# Patient Record
Sex: Female | Born: 1975 | Race: Black or African American | Hispanic: No | Marital: Married | Smoking: Never smoker
Health system: Southern US, Community
[De-identification: ages and names within clinical notes are randomized; demographics above are authoritative.]

## PROBLEM LIST (undated history)

## (undated) DIAGNOSIS — F419 Anxiety disorder, unspecified: Secondary | ICD-10-CM

## (undated) DIAGNOSIS — B379 Candidiasis, unspecified: Secondary | ICD-10-CM

## (undated) DIAGNOSIS — K8020 Calculus of gallbladder without cholecystitis without obstruction: Secondary | ICD-10-CM

## (undated) DIAGNOSIS — Z87440 Personal history of urinary (tract) infections: Secondary | ICD-10-CM

## (undated) DIAGNOSIS — M1990 Unspecified osteoarthritis, unspecified site: Secondary | ICD-10-CM

## (undated) DIAGNOSIS — O24419 Gestational diabetes mellitus in pregnancy, unspecified control: Secondary | ICD-10-CM

## (undated) HISTORY — PX: BREAST SURGERY: SHX581

## (undated) HISTORY — PX: OTHER SURGICAL HISTORY: SHX169

## (undated) HISTORY — PX: CARPAL TUNNEL RELEASE: SHX101

---

## 1997-07-07 ENCOUNTER — Inpatient Hospital Stay (HOSPITAL_COMMUNITY): Admission: AD | Admit: 1997-07-07 | Discharge: 1997-07-07 | Payer: Self-pay | Admitting: *Deleted

## 1997-07-16 ENCOUNTER — Inpatient Hospital Stay (HOSPITAL_COMMUNITY): Admission: AD | Admit: 1997-07-16 | Discharge: 1997-07-16 | Payer: Self-pay | Admitting: *Deleted

## 1997-07-17 ENCOUNTER — Ambulatory Visit (HOSPITAL_COMMUNITY): Admission: RE | Admit: 1997-07-17 | Discharge: 1997-07-17 | Payer: Self-pay | Admitting: Obstetrics

## 1997-07-19 ENCOUNTER — Ambulatory Visit (HOSPITAL_COMMUNITY): Admission: RE | Admit: 1997-07-19 | Discharge: 1997-07-19 | Payer: Self-pay | Admitting: Obstetrics

## 1997-07-21 ENCOUNTER — Inpatient Hospital Stay (HOSPITAL_COMMUNITY): Admission: AD | Admit: 1997-07-21 | Discharge: 1997-07-21 | Payer: Self-pay | Admitting: Obstetrics & Gynecology

## 1997-07-21 ENCOUNTER — Encounter (HOSPITAL_COMMUNITY): Admission: RE | Admit: 1997-07-21 | Discharge: 1997-10-19 | Payer: Self-pay | Admitting: Obstetrics

## 1997-07-22 ENCOUNTER — Inpatient Hospital Stay (HOSPITAL_COMMUNITY): Admission: AD | Admit: 1997-07-22 | Discharge: 1997-07-22 | Payer: Self-pay | Admitting: Obstetrics & Gynecology

## 1997-07-22 ENCOUNTER — Ambulatory Visit (HOSPITAL_COMMUNITY): Admission: RE | Admit: 1997-07-22 | Discharge: 1997-07-22 | Payer: Self-pay | Admitting: Obstetrics & Gynecology

## 1997-07-25 ENCOUNTER — Ambulatory Visit (HOSPITAL_COMMUNITY): Admission: RE | Admit: 1997-07-25 | Discharge: 1997-07-25 | Payer: Self-pay | Admitting: *Deleted

## 1997-08-04 ENCOUNTER — Inpatient Hospital Stay (HOSPITAL_COMMUNITY): Admission: AD | Admit: 1997-08-04 | Discharge: 1997-08-04 | Payer: Self-pay | Admitting: Obstetrics & Gynecology

## 1998-04-11 ENCOUNTER — Other Ambulatory Visit: Admission: RE | Admit: 1998-04-11 | Discharge: 1998-04-11 | Payer: Self-pay | Admitting: *Deleted

## 1999-05-15 ENCOUNTER — Other Ambulatory Visit: Admission: RE | Admit: 1999-05-15 | Discharge: 1999-05-15 | Payer: Self-pay | Admitting: *Deleted

## 2000-05-30 ENCOUNTER — Other Ambulatory Visit: Admission: RE | Admit: 2000-05-30 | Discharge: 2000-05-30 | Payer: Self-pay | Admitting: *Deleted

## 2001-06-02 ENCOUNTER — Other Ambulatory Visit: Admission: RE | Admit: 2001-06-02 | Discharge: 2001-06-02 | Payer: Self-pay | Admitting: Obstetrics and Gynecology

## 2002-06-23 ENCOUNTER — Other Ambulatory Visit: Admission: RE | Admit: 2002-06-23 | Discharge: 2002-06-23 | Payer: Self-pay | Admitting: Obstetrics and Gynecology

## 2015-06-05 ENCOUNTER — Encounter: Payer: Medicaid Other | Attending: Obstetrics and Gynecology | Admitting: Skilled Nursing Facility1

## 2015-06-05 VITALS — Ht 60.0 in | Wt 147.0 lb

## 2015-06-05 DIAGNOSIS — Z3A00 Weeks of gestation of pregnancy not specified: Secondary | ICD-10-CM | POA: Diagnosis not present

## 2015-06-05 DIAGNOSIS — O24419 Gestational diabetes mellitus in pregnancy, unspecified control: Secondary | ICD-10-CM | POA: Diagnosis not present

## 2015-06-05 DIAGNOSIS — O24410 Gestational diabetes mellitus in pregnancy, diet controlled: Secondary | ICD-10-CM

## 2015-06-07 ENCOUNTER — Encounter: Payer: Self-pay | Admitting: Skilled Nursing Facility1

## 2015-06-07 NOTE — Progress Notes (Signed)
  Patient was seen on 06/07/2015 for Gestational Diabetes self-management class at the [facility]. The following learning objectives were met by the patient during this course:   States the definition of Gestational Diabetes  States why dietary management is important in controlling blood glucose  Describes the effects each nutrient has on blood glucose levels  Demonstrates ability to create a balanced meal plan  Demonstrates carbohydrate counting   States when to check blood glucose levels  Demonstrates proper blood glucose monitoring techniques  States the effect of stress and exercise on blood glucose levels  States the importance of limiting caffeine and abstaining from alcohol and smoking  Blood glucose monitor given:  *Meter not given due to insurance coverage  Patient instructed to monitor glucose levels: FBS: 60 - <90 1 hour: <140 2 hour: <120  *Patient received handouts:  Nutrition Diabetes and Pregnancy  Carbohydrate Counting List  Patient will be seen for follow-up as needed.

## 2015-06-12 ENCOUNTER — Ambulatory Visit: Payer: Self-pay

## 2015-07-25 ENCOUNTER — Encounter (HOSPITAL_COMMUNITY): Payer: Self-pay

## 2015-07-25 ENCOUNTER — Inpatient Hospital Stay (HOSPITAL_COMMUNITY): Payer: Medicaid Other

## 2015-07-25 ENCOUNTER — Inpatient Hospital Stay (HOSPITAL_COMMUNITY)
Admission: AD | Admit: 2015-07-25 | Discharge: 2015-08-01 | DRG: 766 | Disposition: A | Discharge status: Home / Self Care | Payer: Medicaid Other | Source: Ambulatory Visit | Attending: Obstetrics & Gynecology | Admitting: Obstetrics & Gynecology

## 2015-07-25 DIAGNOSIS — O24425 Gestational diabetes mellitus in childbirth, controlled by oral hypoglycemic drugs: Secondary | ICD-10-CM | POA: Diagnosis present

## 2015-07-25 DIAGNOSIS — Z302 Encounter for sterilization: Secondary | ICD-10-CM

## 2015-07-25 DIAGNOSIS — O42913 Preterm premature rupture of membranes, unspecified as to length of time between rupture and onset of labor, third trimester: Secondary | ICD-10-CM | POA: Diagnosis present

## 2015-07-25 DIAGNOSIS — O4290 Premature rupture of membranes, unspecified as to length of time between rupture and onset of labor, unspecified weeks of gestation: Secondary | ICD-10-CM

## 2015-07-25 DIAGNOSIS — Z833 Family history of diabetes mellitus: Secondary | ICD-10-CM | POA: Diagnosis not present

## 2015-07-25 DIAGNOSIS — Z8751 Personal history of pre-term labor: Secondary | ICD-10-CM

## 2015-07-25 DIAGNOSIS — Z23 Encounter for immunization: Secondary | ICD-10-CM | POA: Diagnosis not present

## 2015-07-25 DIAGNOSIS — O42013 Preterm premature rupture of membranes, onset of labor within 24 hours of rupture, third trimester: Secondary | ICD-10-CM

## 2015-07-25 DIAGNOSIS — Z3A33 33 weeks gestation of pregnancy: Secondary | ICD-10-CM

## 2015-07-25 DIAGNOSIS — Z98891 History of uterine scar from previous surgery: Secondary | ICD-10-CM

## 2015-07-25 DIAGNOSIS — O34211 Maternal care for low transverse scar from previous cesarean delivery: Secondary | ICD-10-CM | POA: Diagnosis present

## 2015-07-25 HISTORY — DX: Anxiety disorder, unspecified: F41.9

## 2015-07-25 HISTORY — DX: Gestational diabetes mellitus in pregnancy, unspecified control: O24.419

## 2015-07-25 LAB — GLUCOSE, CAPILLARY
GLUCOSE-CAPILLARY: 61 mg/dL — AB (ref 65–99)
Glucose-Capillary: 157 mg/dL — ABNORMAL HIGH (ref 65–99)
Glucose-Capillary: 175 mg/dL — ABNORMAL HIGH (ref 65–99)
Glucose-Capillary: 176 mg/dL — ABNORMAL HIGH (ref 65–99)

## 2015-07-25 LAB — CBC
HCT: 34.7 % — ABNORMAL LOW (ref 36.0–46.0)
Hemoglobin: 11.9 g/dL — ABNORMAL LOW (ref 12.0–15.0)
MCH: 26.6 pg (ref 26.0–34.0)
MCHC: 34.3 g/dL (ref 30.0–36.0)
MCV: 77.5 fL — ABNORMAL LOW (ref 78.0–100.0)
Platelets: 201 10*3/uL (ref 150–400)
RBC: 4.48 MIL/uL (ref 3.87–5.11)
RDW: 14.5 % (ref 11.5–15.5)
WBC: 9 10*3/uL (ref 4.0–10.5)

## 2015-07-25 LAB — TYPE AND SCREEN
ABO/RH(D): O POS
Antibody Screen: NEGATIVE

## 2015-07-25 LAB — ABO/RH: ABO/RH(D): O POS

## 2015-07-25 MED ORDER — ZOLPIDEM TARTRATE 5 MG PO TABS
5.0000 mg | ORAL_TABLET | Freq: Every evening | ORAL | Status: DC | PRN
  Administered 2015-07-26: 5 mg via ORAL
  Filled 2015-07-25: qty 1

## 2015-07-25 MED ORDER — NIFEDIPINE 10 MG PO CAPS
10.0000 mg | ORAL_CAPSULE | ORAL | Status: DC | PRN
  Administered 2015-07-25: 10 mg via ORAL

## 2015-07-25 MED ORDER — LACTATED RINGERS IV BOLUS (SEPSIS)
500.0000 mL | Freq: Once | INTRAVENOUS | Status: AC
Start: 2015-07-25 — End: 2015-07-25
  Administered 2015-07-25: 500 mL via INTRAVENOUS

## 2015-07-25 MED ORDER — DOCUSATE SODIUM 100 MG PO CAPS
100.0000 mg | ORAL_CAPSULE | Freq: Every day | ORAL | Status: DC
  Administered 2015-07-25 – 2015-07-27 (×3): 100 mg via ORAL
  Filled 2015-07-25 (×4): qty 1

## 2015-07-25 MED ORDER — GLYBURIDE 5 MG PO TABS
5.0000 mg | ORAL_TABLET | Freq: Every day | ORAL | Status: DC
  Administered 2015-07-25 – 2015-07-27 (×3): 5 mg via ORAL
  Filled 2015-07-25 (×4): qty 1

## 2015-07-25 MED ORDER — LACTATED RINGERS IV BOLUS (SEPSIS): 500.0000 mL | Freq: Once | INTRAVENOUS | Status: AC

## 2015-07-25 MED ORDER — SODIUM CHLORIDE 0.9 % IV SOLN
2.0000 g | Freq: Four times a day (QID) | INTRAVENOUS | Status: AC
  Administered 2015-07-25 – 2015-07-27 (×8): 2 g via INTRAVENOUS
  Filled 2015-07-25 (×8): qty 2000

## 2015-07-25 MED ORDER — MAGNESIUM SULFATE 50 % IJ SOLN
2.0000 g/h | INTRAVENOUS | Status: DC
  Administered 2015-07-26: 2 g/h via INTRAVENOUS
  Filled 2015-07-25 (×3): qty 80

## 2015-07-25 MED ORDER — NIFEDIPINE 10 MG PO CAPS
10.0000 mg | ORAL_CAPSULE | Freq: Once | ORAL | Status: DC
  Filled 2015-07-25: qty 1

## 2015-07-25 MED ORDER — MAGNESIUM SULFATE BOLUS VIA INFUSION
4.0000 g | Freq: Once | INTRAVENOUS | Status: AC
  Administered 2015-07-25: 4 g via INTRAVENOUS
  Filled 2015-07-25: qty 500

## 2015-07-25 MED ORDER — ACETAMINOPHEN 325 MG PO TABS: 650.0000 mg | ORAL_TABLET | ORAL | Status: DC | PRN

## 2015-07-25 MED ORDER — CALCIUM CARBONATE ANTACID 500 MG PO CHEW
2.0000 | CHEWABLE_TABLET | ORAL | Status: DC | PRN
  Administered 2015-07-27 (×2): 400 mg via ORAL
  Filled 2015-07-25 (×4): qty 2

## 2015-07-25 MED ORDER — INSULIN ASPART 100 UNIT/ML ~~LOC~~ SOLN
0.0000 [IU] | Freq: Three times a day (TID) | SUBCUTANEOUS | Status: DC
  Administered 2015-07-26 (×2): 5 [IU] via SUBCUTANEOUS

## 2015-07-25 MED ORDER — GLYBURIDE 2.5 MG PO TABS
2.5000 mg | ORAL_TABLET | Freq: Every day | ORAL | Status: DC
  Administered 2015-07-25 – 2015-07-26 (×2): 2.5 mg via ORAL
  Filled 2015-07-25 (×3): qty 1

## 2015-07-25 MED ORDER — AMOXICILLIN 500 MG PO CAPS
500.0000 mg | ORAL_CAPSULE | Freq: Three times a day (TID) | ORAL | Status: DC
  Administered 2015-07-27 (×2): 500 mg via ORAL
  Filled 2015-07-25 (×3): qty 1

## 2015-07-25 MED ORDER — NYSTATIN 100000 UNIT/ML MT SUSP
5.0000 mL | Freq: Four times a day (QID) | OROMUCOSAL | Status: DC
  Administered 2015-07-25 – 2015-07-27 (×8): 500000 [IU] via ORAL
  Filled 2015-07-25 (×11): qty 5

## 2015-07-25 MED ORDER — LACTATED RINGERS IV SOLN
INTRAVENOUS | Status: DC
  Administered 2015-07-25 – 2015-07-28 (×8): via INTRAVENOUS

## 2015-07-25 MED ORDER — AZITHROMYCIN 250 MG PO TABS
1000.0000 mg | ORAL_TABLET | Freq: Once | ORAL | Status: AC
  Administered 2015-07-25: 1000 mg via ORAL
  Filled 2015-07-25: qty 4

## 2015-07-25 MED ORDER — BETAMETHASONE SOD PHOS & ACET 6 (3-3) MG/ML IJ SUSP
12.0000 mg | INTRAMUSCULAR | Status: AC
  Administered 2015-07-25 – 2015-07-26 (×2): 12 mg via INTRAMUSCULAR
  Filled 2015-07-25 (×2): qty 2

## 2015-07-25 MED ORDER — NYSTATIN 100000 UNIT/ML MT SUSP
5.0000 mL | Freq: Four times a day (QID) | OROMUCOSAL | Status: DC
  Filled 2015-07-25 (×3): qty 5

## 2015-07-25 MED ORDER — INSULIN ASPART 100 UNIT/ML ~~LOC~~ SOLN
0.0000 [IU] | Freq: Every day | SUBCUTANEOUS | Status: DC
  Administered 2015-07-26: 2 [IU] via SUBCUTANEOUS

## 2015-07-25 MED ORDER — PRENATAL MULTIVITAMIN CH
1.0000 | ORAL_TABLET | Freq: Every day | ORAL | Status: DC
  Administered 2015-07-26: 1 via ORAL
  Filled 2015-07-25 (×3): qty 1

## 2015-07-25 NOTE — H&P (Signed)
Robin Cooke is a 40 y.o. female G3P1011  presenting for PPROM @ 33 weeks 0 days  Pregnancy followed at [facility] since 9 weeks  and remarkable for: Previous C/S; GDM on glyburide; Hx of PTD; AMA; Multiple Fibroids  SROM large amount clear fluid @ 0500 07/25/15  OB History    Gravida Para Term Preterm AB TAB SAB Ectopic Multiple Living   3 1 1  0 1 0 0 1 0 1     History reviewed. No pertinent past medical history. Past Surgical History  Procedure Laterality Date  . Cesarean section    . Breast surgery      Family History:   family history includes Asthma in her other; Diabetes in her other; Stroke in her other. Social History:    reports that she has never smoked. She does not have any smokeless tobacco history on file. She reports that she does not drink alcohol or use illicit drugs.   Prenatal labs: ABO, Rh:  O+ Antibody:  neg Rubella: !Error!Immune RPR:    HBsAg:   Neg HIV:   Neg GBS:      Prenatal Transfer Tool  Maternal Diabetes: Yes:  Diabetes Type:  Insulin/Medication controlled Genetic Screening: Normal Maternal Ultrasounds/Referrals: Normal Fetal Ultrasounds or other Referrals:  None Maternal Substance Abuse:  No Significant Maternal Medications:  None Significant Maternal Lab Results: None   Dilation: 1 Exam by:: Ramsie Ostrander RN Blood pressure 122/70, pulse 110, temperature 99 F (37.2 C), temperature source Oral, resp. rate 18, height 5' 0.5" (1.537 m), weight 151 lb (68.493 kg).  General Appearance: Alert, appropriate appearance for age. No acute distress HEENT Exam: Grossly normal Chest/Respiratory Exam: Normal chest wall and respirations. Clear to auscultation Cardiovascular Exam: Regular rate and rhythm. S1, S2, no murmur Gastrointestinal Exam: soft, non-tender, Uterus gravid with size compatible with GA, Vertex presentation by Leopold's maneuvers Psychiatric Exam: Alert and oriented, appropriate  affect  ++++++++++++++++++++++++++++++++++++++++++++++++++++++++++++++++  Vaginal exam: 1 cm by sterile speculum exam. No digital exam performed  Fetal tracings: Category 1 ; no contractions  ++++++++++++++++++++++++++++++++++++++++++++++++++++++++++++++++   Assessment/Plan: PPROM @ 33 weeks 0 days Admit to antenatal unit after consulting with Dr. Alesia Richards Steroids Latency ABX- ampicillin and azithromycin Ultrasound MFM consult Cbc gbs culture NPO      Delsa Bern MD 07/25/2015, 7:27 AM

## 2015-07-25 NOTE — MAU Note (Addendum)
PT  SAYS  SROM  AT  0500.  - CLEAR.     DENIES     HSV AND MRSA.    VE  LAST WEEK - CLOSED

## 2015-07-25 NOTE — Progress Notes (Signed)
This note also relates to the following rows which could not be included: Pt has BMZ this morning.

## 2015-07-25 NOTE — Progress Notes (Signed)
Hospital day # 0 pregnancy at [redacted]w[redacted]d with PPROM and preterm labor  S: Received 1 dose of Procardia upon admission which spaced out her contractions but then recurred      Now responding well to Magnesium Sulfate with rare contractions not felt by patient      Ongoing LOF with some bloody show  O: BP 101/62 mmHg  Pulse 107  Temp(Src) 99 F (37.2 C) (Oral)  Resp 20  Ht 5' 0.5" (1.537 m)  Wt 151 lb (68.493 kg)  BMI 28.99 kg/m2  SpO2 99%      Fetal tracings:reviewed and reassuring      Uterus gravid and non-tender      Extremities: no significant edema and no signs of DVT       VE: FT/ 30 % effaced/ presenting part not felt  A: 1.[redacted]w[redacted]d with PPROM and preterm labor      2. Class A2 DB on Glyburide      3. Previous cesarean delivery      4. Uncertain presentation  P: 1. Continue tocolysis until BMZ series is complete      2. If persistent active labor, patient is requesting repeat cesarean delivery with BTL as planned      3. Will plan delivery at 34 weeks      4. Continue latency antibiotics per protocol      5. Awaiting ultrasound for presentation and BPP Questions answered  Robin Cooke A  MD 07/25/2015 2:27 PM

## 2015-07-25 NOTE — Progress Notes (Signed)
Pt c/o abdominal pain and pelvic pressure. Describes pain as tightness in mid abdomen which causes pelvic pressure.  Pt states pain started after eating.  EFM to be applied to eval for ctxs.

## 2015-07-26 LAB — GLUCOSE, CAPILLARY
GLUCOSE-CAPILLARY: 202 mg/dL — AB (ref 65–99)
Glucose-Capillary: 81 mg/dL (ref 65–99)
Glucose-Capillary: 221 mg/dL — ABNORMAL HIGH (ref 65–99)
Glucose-Capillary: 215 mg/dL — ABNORMAL HIGH (ref 65–99)

## 2015-07-26 LAB — URINE CULTURE
Culture: NO GROWTH
Special Requests: NORMAL

## 2015-07-26 NOTE — Progress Notes (Signed)
Initial Nutrition Assessment  DOCUMENTATION CODES:   Obesity unspecified  INTERVENTION:  Carbohydrate modified gestational diet  NUTRITION DIAGNOSIS:   Increased nutrient needs related to  (r/t pregnancy and fetal growth requirements) as evidenced by  (33 1/7 weeks IUP).  GOAL:   Patient will meet greater than or equal to 90% of their needs, Weight gain  MONITOR:  Weight trends  REASON FOR ASSESSMENT:  Antenatal, Gestational Diabetes   ASSESSMENT:   33 1/7 weeks, PROM. 3 lb weight gain. Pt reports Hx of nausea early in preg, which reduced appetite. Currently with thrush which she says minimizes appetite. Has long list of foods which she reports cause nausea. Does not tolerate milk well. Has good understanding of GDM diet.  Diet Order:  Diet gestational carb mod Room service appropriate?: Yes; Fluid consistency:: Thin  Skin:  Reviewed, no issues  Height:   Ht Readings from Last 1 Encounters:  07/25/15 5' 0.5" (1.537 m)   Weight:   Wt Readings from Last 1 Encounters:  07/26/15 151 lb 11.2 oz (68.811 kg)    Ideal Body Weight:    100-105 lbs  BMI:  Body mass index is 29.13 kg/(m^2).  Estimated Nutritional Needs:   Kcal:  1800-2000  Protein:  80-90 g  Fluid:  2.1 L  EDUCATION NEEDS:   No education needs identified at this time (educ at [facility] on 05/2015)  Weyman Rodney M.Fredderick Severance LDN Neonatal Nutrition Support Specialist/RD III Pager [phone]      Phone [phone]

## 2015-07-26 NOTE — Progress Notes (Addendum)
Patient ID: Robin Cooke, female   DOB: 26-Aug-1975, 40 y.o.   MRN: [id number] Robin Cooke is a 40 y.o. G3P1011 at [redacted]w[redacted]d admitted for PPROM  Subjective: Feels occasional contractions  Objective: BP 114/66 mmHg  Pulse 104  Temp(Src) 99.1 F (37.3 C) (Oral)  Resp 18  Ht 5' 0.5" (1.537 m)  Wt 151 lb 11.2 oz (68.811 kg)  BMI 29.13 kg/m2  SpO2 99% I/O last 3 completed shifts: In: 4637.3 [P.O.:2091; I.V.:2396.3; IV Piggyback:150] Out: 3050 [Urine:3050] Total I/O In: 340 [P.O.:340] Out: 550 [Urine:550]  Physical Exam:  Gen: alert Chest/Lungs: cta bilaterally  Heart/Pulse: RRR  Abdomen: soft, gravid, nontender Uterine fundus: soft, nontender Skin & Color: warm and dry  EXT: negative Homan's b/l, edema trace  FHT:  FHR: 140-150s bpm, variability: moderate,  accelerations:  Present,  decelerations:  Absent UC:   irregular SVE:   Dilation: Fingertip Effacement (%): 30 Exam by:: Dr. Cletis Media  GBS pending UCx pending  Labs: Lab Results  Component Value Date   WBC 9.0 07/25/2015   HGB 11.9* 07/25/2015   HCT 34.7* 07/25/2015   MCV 77.5* 07/25/2015   PLT 201 07/25/2015   U/S oblique lie U/s in office 07/18/15 4lbs 6oz 53%  Assessment and Plan: has Preterm premature rupture of membranes (PPROM) with onset of labor within 24 hours of rupture in third trimester, antepartum on her problem list. Stable on magnesium Will d/c mg 24hrs after 2nd dose of BMZ Fetal status is overall reassuring Repeat C/S and sterilization if labors  Robin Cooke Y 07/26/2015, 11:33 AM

## 2015-07-27 LAB — CULTURE, BETA STREP (GROUP B ONLY)

## 2015-07-27 LAB — GLUCOSE, CAPILLARY
GLUCOSE-CAPILLARY: 99 mg/dL (ref 65–99)
GLUCOSE-CAPILLARY: 145 mg/dL — AB (ref 65–99)
Glucose-Capillary: 132 mg/dL — ABNORMAL HIGH (ref 65–99)

## 2015-07-27 MED ORDER — SOD CITRATE-CITRIC ACID 500-334 MG/5ML PO SOLN
ORAL | Status: AC
  Administered 2015-07-27: 30 mL
  Filled 2015-07-27: qty 15

## 2015-07-27 MED ORDER — SIMETHICONE 80 MG PO CHEW
80.0000 mg | CHEWABLE_TABLET | Freq: Four times a day (QID) | ORAL | Status: DC | PRN
  Administered 2015-07-27: 80 mg via ORAL
  Filled 2015-07-27: qty 1

## 2015-07-27 MED ORDER — TERBUTALINE SULFATE 1 MG/ML IJ SOLN
INTRAMUSCULAR | Status: AC
  Administered 2015-07-27: 1 mg
  Filled 2015-07-27: qty 1

## 2015-07-27 MED ORDER — NIFEDIPINE 10 MG PO CAPS
10.0000 mg | ORAL_CAPSULE | Freq: Four times a day (QID) | ORAL | Status: DC
  Administered 2015-07-27 (×4): 10 mg via ORAL
  Filled 2015-07-27 (×6): qty 1

## 2015-07-27 MED ORDER — INSULIN ASPART 100 UNIT/ML ~~LOC~~ SOLN
0.0000 [IU] | Freq: Three times a day (TID) | SUBCUTANEOUS | Status: DC
  Administered 2015-07-27 (×2): 2 [IU] via SUBCUTANEOUS

## 2015-07-27 NOTE — Progress Notes (Signed)
Hospital day # 2 pregnancy at [redacted]w[redacted]d with PPROM 07/25/15  S: well, reports good fetal activity      Contractions:none      Vaginal bleeding:none now       Vaginal discharge: no significant change  O: BP 118/62 mmHg  Pulse 97  Temp(Src) 98.4 F (36.9 C) (Axillary)  Resp 16  Ht 5' 0.5" (1.537 m)  Wt 151 lb 11.2 oz (68.811 kg)  BMI 29.13 kg/m2  SpO2 99%      Fetal tracings:reviewed and reassuring      Uterus gravid and non-tender      Extremities: no significant edema and no signs of DVT  A: [redacted]w[redacted]d with PPROM      S/p BMZ and 48 hours of Magnesium sulfate     No evidence of chorioamnionitis     Baby in oblique presentation  P: continue current plan of care      Plan delivery at 34 weeks by repeat cesarean delivery and BTL: scheduled with Dr Mancel Bale 08/01/15 at 14:30   Cesarean section reviewed with pt with R&B including but not limited to:  bleeding, infection, injury to other organs. Low transverse approach planned which will allow vaginal delivery with future pregnancies. Should a vertical incision or inverted T be needed, patient is aware that repeat cesarean sections would be recommended in the future. Expected hospital stay and recovery also discussed.  Tubal ligation reviewed with the patient  Procedure reviewed Informed of the failure rate of 1/500 and irreversibility. Risks of operative complications reviewed with possible anesthetic complication, infection, bleeding and injury to intra-abdominal organ. Recommendation of bilateral salpyngectomy for reduction of ovarian cancer explained. Patient agreeable to proceed.   [id number]  MD 07/27/2015 12:25 PM

## 2015-07-27 NOTE — Progress Notes (Addendum)
  Subjective: Called to see patient with increased pain and pressure.  Objective: BP 110/68 mmHg  Pulse 99  Temp(Src) 98.7 F (37.1 C) (Oral)  Resp 18  Ht 5' 0.5" (1.537 m)  Wt 68.811 kg (151 lb 11.2 oz)  BMI 29.13 kg/m2  SpO2 99% I/O last 3 completed shifts: In: 3423.8 [P.O.:1060; I.V.:2163.8; IV Piggyback:200] Out: [id number] [Urine:4050]    FHT: Category 1 UC:   regular, every 3 minutes SVE:   4-5 cm, 100%, vtx, 0 station Bloody show  Assessment:  IUP at 33 2/7 weeks PROM 07/25/15 Active labor Previous C/S, scheduled for repeat with BTL on 7/11. GBS negative  Plan: Dr. Simona Huh notified--will proceed with C/S Risks and benefits of cesarean were reviewed with patient and family, including anesthesia, bleeding, infection, and damage to other organs.  Patient and family seem to understand these risks and are in agreement with proceeding with cesarean. Desires BTL--consent signed 06/08/15. Ancef 2 gm IV pre-op  Kaniah Rizzolo CNM 07/27/2015, 11:41 PM

## 2015-07-28 ENCOUNTER — Encounter (HOSPITAL_COMMUNITY): Payer: Self-pay | Admitting: Anesthesiology

## 2015-07-28 ENCOUNTER — Encounter (HOSPITAL_COMMUNITY)
Admission: AD | Disposition: A | Discharge status: Home / Self Care | Payer: Self-pay | Source: Ambulatory Visit | Attending: Obstetrics & Gynecology

## 2015-07-28 ENCOUNTER — Inpatient Hospital Stay (HOSPITAL_COMMUNITY): Payer: Medicaid Other | Admitting: Anesthesiology

## 2015-07-28 HISTORY — PX: BILATERAL SALPINGECTOMY: SHX5743

## 2015-07-28 LAB — CBC
HEMATOCRIT: 32.9 % — AB (ref 36.0–46.0)
HEMOGLOBIN: 10.7 g/dL — AB (ref 12.0–15.0)
MCH: 25.5 pg — ABNORMAL LOW (ref 26.0–34.0)
MCHC: 32.5 g/dL (ref 30.0–36.0)
MCV: 78.3 fL (ref 78.0–100.0)
Platelets: 185 10*3/uL (ref 150–400)
RBC: 4.2 MIL/uL (ref 3.87–5.11)
RDW: 14.8 % (ref 11.5–15.5)
WBC: 17.9 10*3/uL — ABNORMAL HIGH (ref 4.0–10.5)

## 2015-07-28 LAB — GLUCOSE, CAPILLARY
GLUCOSE-CAPILLARY: 99 mg/dL (ref 65–99)
GLUCOSE-CAPILLARY: 170 mg/dL — AB (ref 65–99)
Glucose-Capillary: 113 mg/dL — ABNORMAL HIGH (ref 65–99)
Glucose-Capillary: 91 mg/dL (ref 65–99)

## 2015-07-28 SURGERY — Surgical Case
Anesthesia: Spinal

## 2015-07-28 MED ORDER — LACTATED RINGERS IV SOLN
INTRAVENOUS | Status: DC | PRN
  Administered 2015-07-28 (×2): via INTRAVENOUS

## 2015-07-28 MED ORDER — FENTANYL CITRATE (PF) 100 MCG/2ML IJ SOLN
INTRAMUSCULAR | Status: AC
  Filled 2015-07-28: qty 2

## 2015-07-28 MED ORDER — MENTHOL 3 MG MT LOZG: 1.0000 | LOZENGE | OROMUCOSAL | Status: DC | PRN

## 2015-07-28 MED ORDER — LACTATED RINGERS IV SOLN
INTRAVENOUS | Status: DC
  Administered 2015-07-28: 07:00:00 via INTRAVENOUS

## 2015-07-28 MED ORDER — KETOROLAC TROMETHAMINE 30 MG/ML IJ SOLN
INTRAMUSCULAR | Status: AC
  Filled 2015-07-28: qty 1

## 2015-07-28 MED ORDER — SIMETHICONE 80 MG PO CHEW
80.0000 mg | CHEWABLE_TABLET | ORAL | Status: DC
  Administered 2015-07-28 – 2015-07-31 (×3): 80 mg via ORAL
  Filled 2015-07-28 (×3): qty 1

## 2015-07-28 MED ORDER — OXYCODONE-ACETAMINOPHEN 5-325 MG PO TABS
2.0000 | ORAL_TABLET | ORAL | Status: DC | PRN
  Administered 2015-07-28 – 2015-08-01 (×9): 2 via ORAL
  Filled 2015-07-28 (×11): qty 2

## 2015-07-28 MED ORDER — PHENYLEPHRINE 8 MG IN D5W 100 ML (0.08MG/ML) PREMIX OPTIME
INJECTION | INTRAVENOUS | Status: DC | PRN
  Administered 2015-07-28: 60 ug/min via INTRAVENOUS

## 2015-07-28 MED ORDER — SIMETHICONE 80 MG PO CHEW
80.0000 mg | CHEWABLE_TABLET | Freq: Three times a day (TID) | ORAL | Status: DC
  Administered 2015-07-28 – 2015-08-01 (×11): 80 mg via ORAL
  Filled 2015-07-28 (×12): qty 1

## 2015-07-28 MED ORDER — OXYTOCIN 10 UNIT/ML IJ SOLN
INTRAMUSCULAR | Status: AC
Start: 2015-07-28 — End: 2015-07-28
  Filled 2015-07-28: qty 4

## 2015-07-28 MED ORDER — SIMETHICONE 80 MG PO CHEW: 80.0000 mg | CHEWABLE_TABLET | ORAL | Status: DC | PRN

## 2015-07-28 MED ORDER — TETANUS-DIPHTH-ACELL PERTUSSIS 5-2.5-18.5 LF-MCG/0.5 IM SUSP
0.5000 mL | Freq: Once | INTRAMUSCULAR | Status: AC
  Administered 2015-08-01: 0.5 mL via INTRAMUSCULAR

## 2015-07-28 MED ORDER — DIPHENHYDRAMINE HCL 12.5 MG/5ML PO ELIX: 12.5000 mg | ORAL_SOLUTION | Freq: Four times a day (QID) | ORAL | Status: DC | PRN

## 2015-07-28 MED ORDER — SCOPOLAMINE 1 MG/3DAYS TD PT72: 1.0000 | MEDICATED_PATCH | Freq: Once | TRANSDERMAL | Status: DC

## 2015-07-28 MED ORDER — NALOXONE HCL 0.4 MG/ML IJ SOLN: 0.4000 mg | INTRAMUSCULAR | Status: DC | PRN

## 2015-07-28 MED ORDER — OXYTOCIN 10 UNIT/ML IJ SOLN
40.0000 [IU] | INTRAMUSCULAR | Status: DC | PRN
  Administered 2015-07-28: 40 [IU] via INTRAVENOUS

## 2015-07-28 MED ORDER — DIPHENHYDRAMINE HCL 25 MG PO CAPS
25.0000 mg | ORAL_CAPSULE | ORAL | Status: DC | PRN
  Filled 2015-07-28: qty 1

## 2015-07-28 MED ORDER — SODIUM CHLORIDE 0.9% FLUSH: 9.0000 mL | INTRAVENOUS | Status: DC | PRN

## 2015-07-28 MED ORDER — SODIUM CHLORIDE 0.9 % IR SOLN
Status: DC | PRN
  Administered 2015-07-28: 1000 mL

## 2015-07-28 MED ORDER — OXYCODONE-ACETAMINOPHEN 5-325 MG PO TABS
1.0000 | ORAL_TABLET | ORAL | Status: DC | PRN
  Administered 2015-07-28: 1 via ORAL
  Filled 2015-07-28 (×2): qty 1

## 2015-07-28 MED ORDER — METHYLERGONOVINE MALEATE 0.2 MG/ML IJ SOLN: 0.2000 mg | INTRAMUSCULAR | Status: DC | PRN

## 2015-07-28 MED ORDER — SENNOSIDES-DOCUSATE SODIUM 8.6-50 MG PO TABS
2.0000 | ORAL_TABLET | ORAL | Status: DC
  Administered 2015-07-28 – 2015-07-31 (×3): 2 via ORAL
  Filled 2015-07-28 (×4): qty 2

## 2015-07-28 MED ORDER — NALBUPHINE HCL 10 MG/ML IJ SOLN: 5.0000 mg | Freq: Once | INTRAMUSCULAR | Status: DC | PRN

## 2015-07-28 MED ORDER — ONDANSETRON HCL 4 MG/2ML IJ SOLN: 4.0000 mg | Freq: Four times a day (QID) | INTRAMUSCULAR | Status: DC | PRN

## 2015-07-28 MED ORDER — ZOLPIDEM TARTRATE 5 MG PO TABS: 5.0000 mg | ORAL_TABLET | Freq: Every evening | ORAL | Status: DC | PRN

## 2015-07-28 MED ORDER — DIPHENHYDRAMINE HCL 50 MG/ML IJ SOLN: 12.5000 mg | Freq: Four times a day (QID) | INTRAMUSCULAR | Status: DC | PRN

## 2015-07-28 MED ORDER — ONDANSETRON HCL 4 MG/2ML IJ SOLN: 4.0000 mg | Freq: Three times a day (TID) | INTRAMUSCULAR | Status: DC | PRN

## 2015-07-28 MED ORDER — MEPERIDINE HCL 25 MG/ML IJ SOLN
INTRAMUSCULAR | Status: DC | PRN
  Administered 2015-07-28 (×2): 12.5 mg via INTRAVENOUS

## 2015-07-28 MED ORDER — WITCH HAZEL-GLYCERIN EX PADS: 1.0000 "application " | MEDICATED_PAD | CUTANEOUS | Status: DC | PRN

## 2015-07-28 MED ORDER — ONDANSETRON HCL 4 MG/2ML IJ SOLN
INTRAMUSCULAR | Status: DC | PRN
  Administered 2015-07-28: 4 mg via INTRAVENOUS

## 2015-07-28 MED ORDER — METHYLERGONOVINE MALEATE 0.2 MG PO TABS: 0.2000 mg | ORAL_TABLET | ORAL | Status: DC | PRN

## 2015-07-28 MED ORDER — DIBUCAINE 1 % RE OINT: 1.0000 "application " | TOPICAL_OINTMENT | RECTAL | Status: DC | PRN

## 2015-07-28 MED ORDER — PRENATAL MULTIVITAMIN CH
1.0000 | ORAL_TABLET | Freq: Every day | ORAL | Status: DC
  Filled 2015-07-28: qty 1

## 2015-07-28 MED ORDER — ACETAMINOPHEN 500 MG PO TABS
1000.0000 mg | ORAL_TABLET | Freq: Four times a day (QID) | ORAL | Status: AC
  Administered 2015-07-28: 1000 mg via ORAL
  Filled 2015-07-28: qty 2

## 2015-07-28 MED ORDER — DEXAMETHASONE SODIUM PHOSPHATE 4 MG/ML IJ SOLN
INTRAMUSCULAR | Status: DC | PRN
  Administered 2015-07-28: 4 mg via INTRAVENOUS

## 2015-07-28 MED ORDER — KETOROLAC TROMETHAMINE 30 MG/ML IJ SOLN: 30.0000 mg | Freq: Four times a day (QID) | INTRAMUSCULAR | Status: DC | PRN

## 2015-07-28 MED ORDER — NALBUPHINE HCL 10 MG/ML IJ SOLN: 5.0000 mg | INTRAMUSCULAR | Status: DC | PRN

## 2015-07-28 MED ORDER — CEFAZOLIN SODIUM-DEXTROSE 2-4 GM/100ML-% IV SOLN
INTRAVENOUS | Status: AC
  Filled 2015-07-28: qty 100

## 2015-07-28 MED ORDER — COCONUT OIL OIL: 1.0000 "application " | TOPICAL_OIL | Status: DC | PRN

## 2015-07-28 MED ORDER — DIPHENHYDRAMINE HCL 25 MG PO CAPS
25.0000 mg | ORAL_CAPSULE | Freq: Four times a day (QID) | ORAL | Status: DC | PRN
  Administered 2015-07-28: 25 mg via ORAL
  Filled 2015-07-28: qty 1

## 2015-07-28 MED ORDER — DIPHENHYDRAMINE HCL 50 MG/ML IJ SOLN: 12.5000 mg | INTRAMUSCULAR | Status: DC | PRN

## 2015-07-28 MED ORDER — FENTANYL CITRATE (PF) 100 MCG/2ML IJ SOLN
INTRAMUSCULAR | Status: DC | PRN
  Administered 2015-07-28: 80 ug via INTRAVENOUS
  Administered 2015-07-28: 20 ug via INTRATHECAL

## 2015-07-28 MED ORDER — NALOXONE HCL 2 MG/2ML IJ SOSY
1.0000 ug/kg/h | PREFILLED_SYRINGE | INTRAVENOUS | Status: DC | PRN
  Filled 2015-07-28: qty 2

## 2015-07-28 MED ORDER — NYSTATIN 100000 UNIT/ML MT SUSP
5.0000 mL | Freq: Four times a day (QID) | OROMUCOSAL | Status: DC
  Administered 2015-07-28 – 2015-08-01 (×16): 500000 [IU] via OROMUCOSAL
  Filled 2015-07-28 (×24): qty 5

## 2015-07-28 MED ORDER — DEXAMETHASONE SODIUM PHOSPHATE 4 MG/ML IJ SOLN
INTRAMUSCULAR | Status: AC
Start: 2015-07-28 — End: 2015-07-28
  Filled 2015-07-28: qty 1

## 2015-07-28 MED ORDER — ACETAMINOPHEN 325 MG PO TABS: 650.0000 mg | ORAL_TABLET | ORAL | Status: DC | PRN

## 2015-07-28 MED ORDER — COMPLETENATE 29-1 MG PO CHEW
1.0000 | CHEWABLE_TABLET | Freq: Every day | ORAL | Status: DC
  Administered 2015-07-28 – 2015-07-31 (×4): 1 via ORAL
  Filled 2015-07-28 (×6): qty 1

## 2015-07-28 MED ORDER — MEASLES, MUMPS & RUBELLA VAC ~~LOC~~ INJ
0.5000 mL | INJECTION | Freq: Once | SUBCUTANEOUS | Status: DC
  Filled 2015-07-28: qty 0.5

## 2015-07-28 MED ORDER — SCOPOLAMINE 1 MG/3DAYS TD PT72
MEDICATED_PATCH | TRANSDERMAL | Status: DC | PRN
  Administered 2015-07-28: 1 via TRANSDERMAL

## 2015-07-28 MED ORDER — EPHEDRINE 5 MG/ML INJ
INTRAVENOUS | Status: AC
Start: 2015-07-28 — End: 2015-07-28
  Filled 2015-07-28: qty 10

## 2015-07-28 MED ORDER — IBUPROFEN 600 MG PO TABS: 600.0000 mg | ORAL_TABLET | Freq: Four times a day (QID) | ORAL | Status: DC | PRN

## 2015-07-28 MED ORDER — IBUPROFEN 600 MG PO TABS
600.0000 mg | ORAL_TABLET | Freq: Four times a day (QID) | ORAL | Status: DC
  Filled 2015-07-28: qty 1

## 2015-07-28 MED ORDER — PHENYLEPHRINE 8 MG IN D5W 100 ML (0.08MG/ML) PREMIX OPTIME
INJECTION | INTRAVENOUS | Status: AC
Start: 2015-07-28 — End: 2015-07-28
  Filled 2015-07-28: qty 100

## 2015-07-28 MED ORDER — KETOROLAC TROMETHAMINE 30 MG/ML IJ SOLN
30.0000 mg | Freq: Four times a day (QID) | INTRAMUSCULAR | Status: DC | PRN
  Administered 2015-07-28: 30 mg via INTRAMUSCULAR

## 2015-07-28 MED ORDER — SCOPOLAMINE 1 MG/3DAYS TD PT72
MEDICATED_PATCH | TRANSDERMAL | Status: AC
Start: 2015-07-28 — End: 2015-07-28
  Filled 2015-07-28: qty 1

## 2015-07-28 MED ORDER — MORPHINE SULFATE (PF) 0.5 MG/ML IJ SOLN
INTRAMUSCULAR | Status: AC
  Filled 2015-07-28: qty 10

## 2015-07-28 MED ORDER — OXYCODONE-ACETAMINOPHEN 5-325 MG PO TABS
1.0000 | ORAL_TABLET | ORAL | Status: DC | PRN
  Administered 2015-07-28 – 2015-08-01 (×8): 1 via ORAL
  Filled 2015-07-28 (×6): qty 1

## 2015-07-28 MED ORDER — CEFAZOLIN SODIUM-DEXTROSE 2-4 GM/100ML-% IV SOLN
2.0000 g | Freq: Once | INTRAVENOUS | Status: AC
  Administered 2015-07-28: 2 g via INTRAVENOUS
  Filled 2015-07-28: qty 100

## 2015-07-28 MED ORDER — MEPERIDINE HCL 25 MG/ML IJ SOLN: 6.2500 mg | INTRAMUSCULAR | Status: DC | PRN

## 2015-07-28 MED ORDER — OXYTOCIN 40 UNITS IN LACTATED RINGERS INFUSION - SIMPLE MED: 2.5000 [IU]/h | INTRAVENOUS | Status: AC

## 2015-07-28 MED ORDER — IBUPROFEN 100 MG/5ML PO SUSP
600.0000 mg | Freq: Four times a day (QID) | ORAL | Status: DC
  Administered 2015-07-28 – 2015-08-01 (×17): 600 mg via ORAL
  Filled 2015-07-28 (×21): qty 30

## 2015-07-28 MED ORDER — MEPERIDINE HCL 25 MG/ML IJ SOLN
INTRAMUSCULAR | Status: AC
Start: 2015-07-28 — End: 2015-07-28
  Filled 2015-07-28: qty 1

## 2015-07-28 MED ORDER — ONDANSETRON HCL 4 MG/2ML IJ SOLN
INTRAMUSCULAR | Status: AC
  Filled 2015-07-28: qty 2

## 2015-07-28 MED ORDER — FENTANYL 40 MCG/ML IV SOLN
INTRAVENOUS | Status: DC
  Administered 2015-07-28: 07:00:00 via INTRAVENOUS
  Filled 2015-07-28: qty 25

## 2015-07-28 MED ORDER — MORPHINE SULFATE (PF) 0.5 MG/ML IJ SOLN
INTRAMUSCULAR | Status: DC | PRN
  Administered 2015-07-28: .4 mg via INTRATHECAL

## 2015-07-28 MED ORDER — BUPIVACAINE IN DEXTROSE 0.75-8.25 % IT SOLN
INTRATHECAL | Status: DC | PRN
  Administered 2015-07-28: 1.2 mL via INTRATHECAL

## 2015-07-28 MED ORDER — SODIUM CHLORIDE 0.9% FLUSH: 3.0000 mL | INTRAVENOUS | Status: DC | PRN

## 2015-07-28 SURGICAL SUPPLY — 43 items
APL SKNCLS STERI-STRIP NONHPOA (GAUZE/BANDAGES/DRESSINGS) ×2
BENZOIN TINCTURE PRP APPL 2/3 (GAUZE/BANDAGES/DRESSINGS) ×4 IMPLANT
CLAMP CORD UMBIL (MISCELLANEOUS) ×8 IMPLANT
CLOSURE WOUND 1/2 X4 (GAUZE/BANDAGES/DRESSINGS) ×1
CLOTH BEACON ORANGE TIMEOUT ST (SAFETY) ×4 IMPLANT
CONTAINER PREFILL 10% NBF 15ML (MISCELLANEOUS) IMPLANT
DRAIN JACKSON PRT FLT 7MM (DRAIN) IMPLANT
DRSG OPSITE POSTOP 4X10 (GAUZE/BANDAGES/DRESSINGS) ×4 IMPLANT
DURAPREP 26ML APPLICATOR (WOUND CARE) ×4 IMPLANT
ELECT REM PT RETURN 9FT ADLT (ELECTROSURGICAL) ×4
ELECTRODE REM PT RTRN 9FT ADLT (ELECTROSURGICAL) ×2 IMPLANT
EVACUATOR SILICONE 100CC (DRAIN) IMPLANT
EXTRACTOR VACUUM M CUP 4 TUBE (SUCTIONS) IMPLANT
EXTRACTOR VACUUM M CUP 4' TUBE (SUCTIONS)
GLOVE BIOGEL PI IND STRL 7.0 (GLOVE) ×2 IMPLANT
GLOVE BIOGEL PI IND STRL 8.5 (GLOVE) ×2 IMPLANT
GLOVE BIOGEL PI INDICATOR 7.0 (GLOVE) ×2
GLOVE BIOGEL PI INDICATOR 8.5 (GLOVE) ×2
GLOVE ECLIPSE 8.0 STRL XLNG CF (GLOVE) ×8 IMPLANT
GOWN STRL REUS W/TWL LRG LVL3 (GOWN DISPOSABLE) ×12 IMPLANT
HEMOSTAT SURGICEL 2X14 (HEMOSTASIS) ×4 IMPLANT
KIT ABG SYR 3ML LUER SLIP (SYRINGE) IMPLANT
NEEDLE HYPO 22GX1.5 SAFETY (NEEDLE) ×4 IMPLANT
NEEDLE HYPO 25X5/8 SAFETYGLIDE (NEEDLE) IMPLANT
PACK C SECTION WH (CUSTOM PROCEDURE TRAY) ×4 IMPLANT
PAD OB MATERNITY 4.3X12.25 (PERSONAL CARE ITEMS) ×4 IMPLANT
PENCIL SMOKE EVAC W/HOLSTER (ELECTROSURGICAL) ×4 IMPLANT
RINGERS IRRIG 1000ML POUR BTL (IV SOLUTION) ×4 IMPLANT
SPONGE LAP 18X18 X RAY DECT (DISPOSABLE) ×4 IMPLANT
STRIP CLOSURE SKIN 1/2X4 (GAUZE/BANDAGES/DRESSINGS) ×3 IMPLANT
SUT MNCRL AB 3-0 PS2 27 (SUTURE) IMPLANT
SUT PLAIN 0 NONE (SUTURE) ×8 IMPLANT
SUT SILK 3 0 FS 1X18 (SUTURE) IMPLANT
SUT VIC AB 0 CT1 27 (SUTURE) ×6
SUT VIC AB 0 CT1 27XBRD ANBCTR (SUTURE) ×4 IMPLANT
SUT VIC AB 2-0 CTX 36 (SUTURE) ×8 IMPLANT
SUT VIC AB 3-0 CT1 27 (SUTURE)
SUT VIC AB 3-0 CT1 TAPERPNT 27 (SUTURE) IMPLANT
SUT VIC AB 3-0 SH 27 (SUTURE)
SUT VIC AB 3-0 SH 27X BRD (SUTURE) IMPLANT
SYR CONTROL 10ML LL (SYRINGE) ×4 IMPLANT
TOWEL OR 17X24 6PK STRL BLUE (TOWEL DISPOSABLE) ×4 IMPLANT
TRAY FOLEY CATH SILVER 14FR (SET/KITS/TRAYS/PACK) ×4 IMPLANT

## 2015-07-28 NOTE — Brief Op Note (Addendum)
07/25/2015 - 07/28/2015  2:10 AM  PATIENT:  Robin Cooke  40 y.o. female  PRE-OPERATIVE DIAGNOSIS:  IUP @ 33 3/7 weeks, PPROM, H/o previous cesarean section, desires sterilization  POST-OPERATIVE DIAGNOSIS:  Same, Fibroids  PROCEDURE:  Procedure(s): CESAREAN SECTION (N/A), Repeat Bilateral Salpingectomy  SURGEON:  Surgeon(s) and Role:     * Thurnell Lose, MD  PHYSICIAN ASSISTANT: n/a   ASSISTANTS: Donnel Saxon, CNM   ANESTHESIA:   spinal  EBL:  Total I/O In: 2650 [I.V.:2650] Out: 700 [Urine:300; Blood:400]  BLOOD ADMINISTERED:none  DRAINS: Urinary Catheter (Foley)   LOCAL MEDICATIONS USED:  NONE  SPECIMEN:  Source of Specimen:  Placenta, bilateral fallopian tubes  DISPOSITION OF SPECIMEN:  PATHOLOGY  COUNTS:  YES  TOURNIQUET:  * No tourniquets in log *  DICTATION: .Other Dictation: Dictation Number [id number]...  PLAN OF CARE: Transfer to Women's Unit after PACU  PATIENT DISPOSITION:  PACU - hemodynamically stable.   Delay start of Pharmacological VTE agent (>24hrs) due to surgical blood loss or risk of bleeding: yes

## 2015-07-28 NOTE — Transfer of Care (Addendum)
Immediate Anesthesia Transfer of Care Note  Patient: Robin Cooke  Procedure(s) Performed: Procedure(s): CESAREAN SECTION (N/A)  Patient Location: PACU  Anesthesia Type:Spinal  Level of Consciousness: awake, alert , oriented and patient cooperative  Airway & Oxygen Therapy: Patient Spontanous Breathing  Post-op Assessment: Report given to RN and Post -op Vital signs reviewed and stable  Post vital signs: Reviewed and stable  Last Vitals:  TEMP 97.6 axillary BP 116/59 HR 88 RR 21 POX  Last Pain:  Pian level  0 Pain goal  2-4     Complications: No apparent anesthesia complications

## 2015-07-28 NOTE — Addendum Note (Signed)
Addendum  created 07/28/15 0759 by Rayvon Char, CRNA   Modules edited: Clinical Notes   Clinical Notes:  File: [id number]

## 2015-07-28 NOTE — Anesthesia Postprocedure Evaluation (Signed)
Anesthesia Post Note  Patient: Robin Cooke  Procedure(s) Performed: Procedure(s) (LRB): CESAREAN SECTION (N/A) BILATERAL SALPINGECTOMY  Patient location during evaluation: Women's Unit Anesthesia Type: Spinal Level of consciousness: oriented and awake and alert Pain management: pain level controlled Vital Signs Assessment: post-procedure vital signs reviewed and stable Respiratory status: spontaneous breathing and respiratory function stable Cardiovascular status: blood pressure returned to baseline and stable Postop Assessment: no headache, no backache and patient able to bend at knees Anesthetic complications: no     Last Vitals:  Filed Vitals:   07/28/15 0500 07/28/15 0649  BP: 115/63 110/60  Pulse: 75 61  Temp: 36.7 C   Resp: 18 16    Last Pain:  Filed Vitals:   07/28/15 0651  PainSc: 1    Pain Goal: Patients Stated Pain Goal: 3 (07/28/15 [id number])               Rayvon Char

## 2015-07-28 NOTE — Progress Notes (Signed)
PCA Fentanyl 78ml wasted in sink.  Witnessed by B. Jimmye Norman, Therapist, sports.

## 2015-07-28 NOTE — Progress Notes (Signed)
Notified by CNM that pt is in labor, 4-5 cm and quite uncomfortable. In room to consent patient for repeat cesarean section and bilateral salpingectomy.  Medicaid papers reviewed. Pt appears uncomfortable, shaking.  Pt consented for repeat cesarean section and Bilateral salpingectomy, possible BTL. Ancef 2 grams on call to OR.

## 2015-07-28 NOTE — Consult Note (Signed)
Neonatology Note:   Attendance at C-section:    I was asked by Dr. Simona Huh to attend this repeat C/S at 33 3/7 weeks due to PTL and PPROM. The mother is a G3P1A1 O pos, GBS neg with GDM, on glyburide. SROM 68 hours prior to delivery, fluid clear. She was admitted and given 2 doses of Betamethasone and latency antibiotics (GBS was unknown initially). She also got Magnesium sulfate for 48 hours, Procardia, and Terbutaline. She got several doses of Insulin in addition to her usual Glyburide. She began to have contractions and was 4-5 cm dilated when checked the evening of 7/6, so C-section was performed. Infant vigorous with good spontaneous cry and tone. Delayed cord clamping was done. Needed only minimal bulb suctioning. Pulse oximetry showed his O2 saturations were low in room air at 4-5 minutes, and he was beginning to have retractions. Air exchange was diminished. We placed him on the neopuff +5-6 and added supplemental O2 until his O2 saturations were within expected parameters, needing about 70% FIO2. Ap 8/8. He was moving air better on CPAP and remained stable on this. He was seen by his parents, then was transported to the NICU for further care, with his father in attendance.  Real Cons, MD

## 2015-07-28 NOTE — Op Note (Signed)
Robin Cooke, Robin Cooke               ACCOUNT NO.:  [id number]  MEDICAL RECORD NO.:  [id number]  LOCATION:  [facility]                          FACILITY:  [facility]  PHYSICIAN:  Jola Schmidt, MD   DATE OF BIRTH:  1975/10/30  DATE OF PROCEDURE:  07/28/2015 DATE OF DISCHARGE:                              OPERATIVE REPORT   PREOP DIAGNOSIS:  Intrauterine pregnancy at 65 and 3 weeks, preterm, premature rupture of membranes, labor, history of previous C-section and desires sterilization.  POSTOPERATIVE DIAGNOSIS:  Intrauterine pregnancy at 11 and 3 weeks, preterm, premature rupture of membranes, labor, history of previous C- section and desires sterilization.  PROCEDURE:  Primary repeat cesarean section and bilateral salpingectomy.  SURGEON:  Raul Del. Simona Huh, MD  ASSISTANT:  Donnel Saxon, certified nurse midwife.  ANESTHESIA:  Spinal.  ESTIMATED BLOOD LOSS:  400.  URINE:  300.  DRAINS:  Foley.  LOCAL:  None.  SPECIMEN:  Placenta and bilateral fallopian tubes.  Disposition of specimen to Pathology.  DISPOSITION:  To PACU, hemodynamically stable.  COMPLICATIONS:  None.  FINDINGS:  Viable female infant, vertex, 4 pounds 2.7 ounces, Apgars 8 and 8, normal lower uterine segment, multiple uterine fibroids, and normal fallopian tubes, and ovaries bilaterally.  INDICATIONS:  The patient was admitted 3 days prior to delivery for preterm premature rupture of membranes.  She received magnesium tocolysis, betamethasone, and latency antibiotics.  On the night of delivery, the patient started having a lot of pressure and pain.  Her cervical exam at the bedside in her room was 4-5 cm, zero station.  She was previously oblique, baby was vertex at that time.  She was then consented for a repeat C section and taken to the operating room.  Her cervical exam prior to the cesarean section was 7 cm, +1 station.  The patient underwent spinal anesthesia without complications.  She was prepped and  draped in a normal sterile fashion in the supine position. Time-out was taken.  Ancef 2 g IV was administered.  SCDs were on and operating.  After adequate anesthesia was confirmed and abdomen was marked, a scalpel was used to incise the previous incision that was carried down to the underlying layer of the fascia.  The fascia was then cut with the curved Mayo scissors and extended laterally.  The fascia was dissected off the rectus muscle sharply.  The patient's rectus muscles were not approximated.  Peritoneum was easily visualized and entered bluntly and stretched.  The bladder flap was developed incising the serosa.  The lower uterine segment appeared thin, but when it was incised with a scalpel, it was actually pretty thick.  A transverse incision was made with a scalpel and extended manually.  The fetal head was really low, but he was brought up easily to the incision and he was delivered through the incision atraumatically.  No nuchal cord was noted.  His nose and mouth were suctioned.  Delayed cord clamping from 1 minute was done.  The baby was handed off to the awaiting NICU team.  However, he had a strong vigorous cry.  He would eventually go to the NICU due to some CPAP need for support with breathing.  The placenta was removed manually.  The uterus was cleared of all clots and debris with a moist laparotomy sponge.  There were 2 laps in the belly because of omentum and small intestines that needed to be retracted.  The incision was closed with 0 Vicryl in a continuous fashion.  One suture was used for hemostasis on the right-hand side. Vicryl was used in a continuous locked fashion.  The hysterotomy incision was hemostatic.  A moist laparotomy sponge was rolled while the uterus was exteriorized to perform the bilateral salpingectomy.  The fallopian tubes were identified.  A window in the mesosalpinx was made with the Bovie.  There were some adhesions on the left tube to  the ovary, so that was ligated with Bovie.  A muscle window was made in the mesosalpinx.  The Kelly clamps were used to grasp the mesosalpinx to make the pedicle, transected with the Metzenbaum scissors, and ligated with 0 plain gut x2.  Another window was made in the mesosalpinx.  Kelly clamp was used for hemostasis and ligation with 0 plain gut.  There was some bleeding on the left side near the great vessels that was cauterized with Bovie and at the end of the procedure, I will place Surgicel there.  The tube was transected and the lumen was cauterized.  Same thing was done on the opposite side.  On the left side it was done in 3 bites, on the right side it was done in 2.  The patient did have a pedunculated fibroid that was slightly bleeding and that was hemostatic.  The uterus was gently returned to the abdomen.  Surgicel was applied on the left side and under the fibroid.  I was unable to visualize the right side to put Surgicel on, but there was no obvious bleeding from that side.  The lower uterus segment was still hemostatic.  Interceed was placed in an inverted T fashion.  The peritoneum was grasped with Kelly clamps and reapproximated with 2-0 plain gut.  Fascia was reapproximated with 0 Vicryl in a continuous fashion and the subcutaneous layer was reapproximated with 2-0 plain gut, which was pretty thin.  The skin was reapproximated with 4-0 Monocryl in a continuous locked fashion.  Steri-Strips and benzoin would be applied, pressure dressing was applied.  All instrument, sponge, and needle counts were correct x3. The patient was not tolerating the procedure well, she received fentanyl and by the end of the procedure, she was resting quietly.    Jola Schmidt, MD    EBV/MEDQ  D:  07/28/2015  T:  07/28/2015  Job:  [id number]

## 2015-07-28 NOTE — Anesthesia Postprocedure Evaluation (Signed)
Anesthesia Post Note  Patient: Robin Cooke  Procedure(s) Performed: Procedure(s) (LRB): CESAREAN SECTION (N/A) BILATERAL SALPINGECTOMY  Patient location during evaluation: PACU Anesthesia Type: Spinal Level of consciousness: awake and alert and patient cooperative Pain management: pain level controlled Vital Signs Assessment: post-procedure vital signs reviewed and stable Respiratory status: spontaneous breathing Cardiovascular status: blood pressure returned to baseline Postop Assessment: no headache and patient able to bend at knees Anesthetic complications: no     Last Vitals:  Filed Vitals:   07/28/15 0330 07/28/15 0359  BP:  105/59  Pulse: 82 66  Temp: 36.7 C 36.5 C  Resp: 13 18    Last Pain:  Filed Vitals:   07/28/15 0400  PainSc: 0-No pain   Pain Goal:                 [name] A

## 2015-07-28 NOTE — Anesthesia Preprocedure Evaluation (Signed)
Anesthesia Evaluation  Patient identified by MRN, date of birth, ID band Patient awake    Reviewed: Allergy & Precautions, NPO status , Patient's Chart, lab work & pertinent test results  Airway Mallampati: I  TM Distance: >3 FB Neck ROM: Full    Dental  (+) Teeth Intact   Pulmonary    breath sounds clear to auscultation       Cardiovascular  Rhythm:Regular Rate:Normal     Neuro/Psych    GI/Hepatic   Endo/Other  diabetes, Well Controlled, Gestational, Oral Hypoglycemic Agents  Renal/GU      Musculoskeletal   Abdominal   Peds  Hematology   Anesthesia Other Findings Previous C/S now in active labor.  Reproductive/Obstetrics                             Anesthesia Physical Anesthesia Plan  ASA: III and emergent  Anesthesia Plan: Spinal   Post-op Pain Management:    Induction: Intravenous  Airway Management Planned:   Additional Equipment:   Intra-op Plan:   Post-operative Plan:   Informed Consent: I have reviewed the patients History and Physical, chart, labs and discussed the procedure including the risks, benefits and alternatives for the proposed anesthesia with the patient or authorized representative who has indicated his/her understanding and acceptance.     Plan Discussed with: CRNA and Anesthesiologist  Anesthesia Plan Comments:         Anesthesia Quick Evaluation

## 2015-07-28 NOTE — Progress Notes (Signed)
CSW acknowledges NICU admission.   Patient screened out for psychosocial assessment due to none of the following apply:  Psychosocial stressors documented in mother or baby's chart  Gestation less than 32 weeks  Code at delivery   Infant with anomalies  Please contact the Clinical Social Worker if specific needs arise, or by MOB's request.  Lane Hacker, MSW Clinical Social Work: System Wide [employer] Coverage for [employer] social worker [phone]

## 2015-07-28 NOTE — Progress Notes (Signed)
Donny Pique  Subjective: Postpartum Day 0: Cesarean Delivery Patient reports tolerating clears.  Ambulating and has voided already.  Pain well controlled with PCA pump, she desires oral pain meds use now.     Objective: Vital signs in last 24 hours: Temp:  [97.6 F (36.4 C)-98.7 F (37.1 C)] 97.7 F (36.5 C) (07/07 0900) Pulse Rate:  [46-99] 77 (07/07 1206) Resp:  [13-22] 14 (07/07 0951) BP: (96-116)/(53-75) 96/59 mmHg (07/07 1206) SpO2:  [85 %-100 %] 98 % (07/07 0951)  Physical Exam:  General: alert, cooperative and no distress Lochia: appropriate Uterine Fundus: firm Incision: no significant drainage DVT Evaluation: No evidence of DVT seen on physical exam.   Recent Labs  07/28/15 0614  HGB 10.7*  HCT 32.9*    Assessment/Plan: Status post Cesarean section. Doing well postoperatively.  Continue current care. Reviewed with patient circumstances surrounding her delivery which were dissatisfying for her. Support and reassurance given to patient.   -Baby in NICU stable. -May DC PCA and start Percocet and ibuprofen for pain prn.   [facility] 07/28/2015, 1:28 PM

## 2015-07-28 NOTE — Lactation Note (Addendum)
This note was copied from a baby's chart. Lactation Consultation Note  Patient Name: Robin Cooke Date: 07/28/2015 Reason for consult: Initial assessment;NICU baby;Infant < 6lbs   Initial consult with mom of 5 hour old NICU infant. Mom is drowsy but interactive during consultation. Mom with GDM. Mom reports she had a previous NICU infant that never latched.Mom report she has had saline breast implants since having her first child. Mom asked if this would affect milk supply. Told mom there is a possibility of nerve and ducts being effected but we will not know until we begin pumping and monitor supply. She reports she had a low milk supply with first child.   Mom with firm breasts and short shafted flat to inverted nipples. Showed mom how to hand express and she returned demonstration. No colostrum noted at this time. Set up DEBP with instructions for set up, assembling, disassembling, cleaning and milk storage for NICU infant. Enc mom to pump every 2-3 hours with DEBP on Initiate setting for 15 minutes followed by hand expression. Mom had colostrum collection containers and # stickers in room. Dad to request Breast milk labels when visiting NICU.   Mom plans to call insurance company to inquire about a pump. Mom was informed of 2 week pump rentals and pumping rooms in NICU.  Hand expression Handout, BF Resources Handout, Providing Milk for your Baby in NICU Booklet and [place] [facility] Brochure given. Reviewed pumping schedule and what to expect with pumping, Milk storage for NICU baby, and milk coming to volume in the early days. Informed mom of IP/OP Services, BF Support Groups and [place] phone #. Follow up tomorrow and prn.     Maternal Data Does the patient have breastfeeding experience prior to this delivery?: Yes  Feeding    LATCH Score/Interventions                      Lactation Tools Discussed/Used WIC Program: No Pump Review: Setup, frequency, and cleaning;Milk Storage  (Milk Storage for NICU infant) Initiated by:: Robin Mattes, RN, IBCLC Date initiated:: 07/28/15   Consult Status Consult Status: Follow-up Date: 07/29/15 Follow-up type: In-patient    [name] [name] 07/28/2015, 10:48 AM

## 2015-07-28 NOTE — Anesthesia Procedure Notes (Signed)
Spinal Patient location during procedure: OR Start time: 07/28/2015 12:31 AM Staffing Anesthesiologist: Ziere Docken Spinal Block Patient position: sitting Prep: DuraPrep Patient monitoring: heart rate, cardiac monitor and blood pressure Approach: midline Location: L3-4 Injection technique: single-shot Needle Needle type: Spinocan  Needle gauge: 24 G Needle length: 5 cm Needle insertion depth: 4 cm Assessment Sensory level: T6

## 2015-07-28 NOTE — Progress Notes (Signed)
PCA Fentanyl 22 mL wasted in sink.   Witness Zenaida Deed, RN

## 2015-07-29 LAB — RPR: RPR: NONREACTIVE

## 2015-07-29 LAB — GLUCOSE, CAPILLARY
GLUCOSE-CAPILLARY: 108 mg/dL — AB (ref 65–99)
GLUCOSE-CAPILLARY: 165 mg/dL — AB (ref 65–99)
Glucose-Capillary: 128 mg/dL — ABNORMAL HIGH (ref 65–99)

## 2015-07-29 NOTE — Lactation Note (Signed)
This note was copied from a baby's chart. Lactation Consultation Note  Mother is not pumping consistently.  Reviewed hand expression with her and drops of colostrum were expressed.  Discussed supply and demand and encouraged pumping every 3 hours along with hand expressing.    Patient Name: Boy Adiline Tokunaga [id number] Date: 07/29/2015 Reason for consult: Follow-up assessment   Maternal Data    Feeding    LATCH Score/Interventions                      Lactation Tools Discussed/Used     Consult Status Consult Status: Follow-up    Van Clines 07/29/2015, 1:03 PM

## 2015-07-29 NOTE — Progress Notes (Signed)
Robin Cooke  Subjective: Postpartum Day 1: Repeat  Cesarean Delivery Patient reports tolerating PO, + flatus and no problems voiding.  With gas pains.   Objective: Vital signs in last 24 hours: Temp:  [98.4 F (36.9 C)-98.9 F (37.2 C)] 98.7 F (37.1 C) (07/08 1308) Pulse Rate:  [65-105] 105 (07/08 1308) Resp:  [16-20] 18 (07/08 1308) BP: (96-110)/(47-89) 103/69 mmHg (07/08 1308) SpO2:  [98 %-100 %] 100 % (07/08 1308)  Physical Exam:  General: alert, cooperative and no distress Lochia: appropriate Uterine Fundus: firm Abdomen: Soft, moderate distension.  Positive bowel sounds.   Incision: no significant drainage DVT Evaluation: No evidence of DVT seen on physical exam.   Recent Labs  07/28/15 0614  HGB 10.7*  HCT 32.9*   . ibuprofen  600 mg Oral Q6H  . measles, mumps and rubella vaccine  0.5 mL Subcutaneous Once  . nystatin  5 mL Mouth/Throat QID  . prenatal vitamin w/FE, FA  1 tablet Oral Q1200  . scopolamine  1 patch Transdermal Once  . senna-docusate  2 tablet Oral Q24H  . simethicone  80 mg Oral TID PC  . simethicone  80 mg Oral Q24H  . Tdap  0.5 mL Intramuscular Once   acetaminophen, coconut oil, witch hazel-glycerin **AND** dibucaine, diphenhydrAMINE, diphenhydrAMINE **OR** diphenhydrAMINE, menthol-cetylpyridinium, methylergonovine **OR** methylergonovine, nalbuphine **OR** nalbuphine, nalbuphine **OR** nalbuphine, naLOXone (NARCAN) adult infusion for PRURITIS, naloxone **AND** sodium chloride flush, ondansetron (ZOFRAN) IV, oxyCODONE-acetaminophen, oxyCODONE-acetaminophen, oxyCODONE-acetaminophen, simethicone, zolpidem  Assessment/Plan: Status post Reepat Cesarean section and BTL. Doing well postoperatively.  Continue current care. Encouraged him ambulation and Mylicon used to help with gas pain  Neonate stable in the NICU.  [facility] 07/29/2015, 5:03 PM

## 2015-07-30 LAB — GLUCOSE, CAPILLARY
GLUCOSE-CAPILLARY: 97 mg/dL (ref 65–99)
Glucose-Capillary: 93 mg/dL (ref 65–99)
Glucose-Capillary: 133 mg/dL — ABNORMAL HIGH (ref 65–99)

## 2015-07-30 NOTE — Lactation Note (Signed)
This note was copied from a baby's chart. Lactation Consultation Note  Mother recently pumped and is now very uncomfortable with cramping. No output at this time from pumping. Recommend she void before pumping.  Discussed pain medication.   Mother states she did well with hand expression yesterday so she plans to hand express the next time. Provided extra colostrum containers. Encouraged her to go back to pumping to help establish her milk supply. Discussed at home pump options and mother states she may buy DEBP for home use.   Patient Name: Robin Cooke [id number] Date: 07/30/2015     Maternal Data    Feeding Feeding Type: Formula Length of feed: 30 min  LATCH Score/Interventions                      Lactation Tools Discussed/Used     Consult Status      Vivianne Master Boschen 07/30/2015, 8:16 AM

## 2015-07-30 NOTE — Progress Notes (Signed)
Subjective: Postpartum Day 2: Cesarean Delivery with bilateral salpyngectomy Patient reports that pain is well-managed.Lochia normal.  Ambulating, voiding, tolerating diet as ordered without difficulty. Normal flatus.  Absent bowel movement.  Objective: Vital signs in last 24 hours: Temp:  [98.2 F (36.8 C)-98.7 F (37.1 C)] 98.3 F (36.8 C) (07/09 0505) Pulse Rate:  [71-105] 85 (07/09 0505) Resp:  [16-18] 18 (07/09 0505) BP: (103-128)/(65-86) 128/86 mmHg (07/09 0505) SpO2:  [100 %] 100 % (07/09 0505)  Physical Exam:  General: alert Lochia: appropriate Uterine Fundus: firm and appropriately tender Incision: dressing dry and clean DVT Evaluation: No evidence of DVT seen on physical exam. Edema 1+   Recent Labs  07/28/15 0614  HGB 10.7*  HCT 32.9*    Assessment/Plan: Status post Cesarean section. Doing well postoperatively.  Continue current care. Anticipate discharge tomorrow but mom requesting to stay extra day: will verify benefits tomorrow  Javion Holmer A 07/30/2015, 11:10 AM

## 2015-07-31 DIAGNOSIS — Z98891 History of uterine scar from previous surgery: Secondary | ICD-10-CM

## 2015-07-31 DIAGNOSIS — Z8751 Personal history of pre-term labor: Secondary | ICD-10-CM

## 2015-07-31 LAB — GLUCOSE, CAPILLARY
GLUCOSE-CAPILLARY: 114 mg/dL — AB (ref 65–99)
Glucose-Capillary: 142 mg/dL — ABNORMAL HIGH (ref 65–99)
Glucose-Capillary: 115 mg/dL — ABNORMAL HIGH (ref 65–99)

## 2015-07-31 NOTE — Progress Notes (Signed)
Subjective: Postpartum Day 3: Cesarean Delivery Patient reports no problems voiding.  +BM and flatus.  No nausea or vomiting but feels like she is having some challenges with pain control.  Reports baby doing well in NICU.  Objective: Vital signs in last 24 hours: Temp:  [98.1 F (36.7 C)-99.1 F (37.3 C)] 98.1 F (36.7 C) (07/10 1100) Pulse Rate:  [80-94] 80 (07/10 1126) Resp:  [18-20] 18 (07/10 1126) BP: (112-121)/(53-74) 115/53 mmHg (07/10 1126) SpO2:  [100 %] 100 % (07/10 1126)  Physical Exam:  General: alert and no distress Lochia: appropriate Uterine Fundus: firm Incision: dressing with small stain and intact DVT Evaluation: No evidence of DVT seen on physical exam.  No results for input(s): HGB, HCT in the last 72 hours.  Assessment/Plan: Status post Cesarean section. Postoperative course complicated by pain mgmt control.  Continue current care Will give ibuprofen as standing order and percocet for breakthrough pain Anticipate d/c tomorrow   Delice Lesch 07/31/2015, 11:28 AM

## 2015-07-31 NOTE — Lactation Note (Signed)
This note was copied from a baby's chart. Lactation Consultation Note  Patient Name: Boy Clestine Risner [id number] Date: 07/31/2015 Reason for consult: Follow-up assessment;NICU baby NICU baby 69 hours old. Mom reports that she was cramping when she used the bedside DEBP, so she has been hand expressing since that one attempt. Discussed why mom cramping while pumping, and enc mom to eat, take pain meds, and then pump. Mom asked to be shown how to use DEBP again, and decided that she did want to pump. Reviewed hand expression again as well. Mom began pumping and reported no discomfort. Enc mom to pump 8 times/24 hours followed by hand expression.  Mom reports breast augmentation in 2012, and states that she thinks the saline implants were inserted through incision under each breast. Mom reports that she did not have a good supply with first baby, also a preemie in the NICU. Discussed the benefits of DEBP and routine pumping. Mom requested and was given paperwork for a 2-week DEBP loaner. Enc mom to call for assistance as needed with pumping/hand expressing.   Maternal Data Has patient been taught Hand Expression?: Yes  Feeding Feeding Type: Formula Length of feed: 60 min  LATCH Score/Interventions                      Lactation Tools Discussed/Used     Consult Status Consult Status: Follow-up Date: 08/01/15 Follow-up type: In-patient    Inocente Salles 07/31/2015, 3:31 PM

## 2015-08-01 LAB — GLUCOSE, CAPILLARY: GLUCOSE-CAPILLARY: 95 mg/dL (ref 65–99)

## 2015-08-01 MED ORDER — IBUPROFEN 600 MG PO TABS
600.0000 mg | ORAL_TABLET | Freq: Four times a day (QID) | ORAL | Status: DC | PRN
Start: 2015-08-01 — End: 2015-10-31

## 2015-08-01 MED ORDER — OXYCODONE-ACETAMINOPHEN 5-325 MG PO TABS: 1.0000 | ORAL_TABLET | Freq: Four times a day (QID) | ORAL | Status: DC | PRN

## 2015-08-01 MED ORDER — NORETHINDRONE 0.35 MG PO TABS: 1.0000 | ORAL_TABLET | Freq: Every day | ORAL | Status: DC

## 2015-08-01 NOTE — Lactation Note (Signed)
This note was copied from a baby's chart. Lactation Consultation Note  Patient Name: [name] [id number] Date: 08/01/2015 Reason for consult: Follow-up assessment;NICU baby  NICU baby 99 days old. Mom reports that she will be discharged later today. Mom has [place] phone number on the board in her room, and states she will call when her husband returns to the hospital. Mom states that she is pumping routinely and her colostrum is increasing. Mom reports that she took colostrum to NICU for the baby.  Maternal Data    Feeding Feeding Type: Formula Length of feed: 60 min  LATCH Score/Interventions                      Lactation Tools Discussed/Used     Consult Status Consult Status: Follow-up Date: 08/02/15 Follow-up type: In-patient    Inocente Salles 08/01/2015, 1:40 PM

## 2015-08-01 NOTE — Discharge Summary (Addendum)
Obstetric Discharge Summary Reason for Admission: PPROM at 33wks Prenatal Procedures: ultrasound and BMZ and Antibiotics with Hospital Admission Intrapartum Procedures: C-Section and Sterilization (BS) Postpartum Procedures: none Complications-Operative and Postpartum: none HEMOGLOBIN  Date Value Ref Range Status  07/28/2015 10.7* 12.0 - 15.0 g/dL Final   HCT  Date Value Ref Range Status  07/28/2015 32.9* 36.0 - 46.0 % Final    Physical Exam:  General: alert and no distress Lochia: appropriate Uterine Fundus: firm Incision: dressing intact DVT Evaluation: No evidence of DVT seen on physical exam. Trace edema  Discharge Diagnoses: PPROM with Delivery at 71 3/7wks  Discharge Information: Date: 08/01/2015 Activity: pelvic rest Diet: routine Medications: PNV, Ibuprofen and Percocet and patient requesting OCPs to help with bleeding control  Condition: stable Instructions: refer to practice specific booklet Discharge to: home and check HgbA1C at [facility] visit  Follow-up Information    Follow up with [facility] In 6 weeks.   Specialty:  Obstetrics and Gynecology   Why:  call for an appointment   Contact information:   [address] [place] [phone]      Newborn Data: Live born female  Birth Weight: 4 lb 2.7 oz (1890 g) APGAR: 8, 8  Home with mother.  Kaine Mcquillen Y 08/01/2015, 10:36 AM

## 2015-08-01 NOTE — Progress Notes (Signed)
Patient A/O x4. Vital signs WNL. Patients belongings accounted for. Discharge instructions reviewed and signed by previous shift RN. Patient has prescription. Patient visiting NICU prior to leaving hospital. Patient ambulatory, assisted to elevator by nursing staff.

## 2015-08-01 NOTE — Lactation Note (Addendum)
This note was copied from a baby's chart. Lactation Consultation Note Mom requests 2 week DEBP rental.  [place] assisted with payment received and mom plans discharge tonight.    Patient Name: Robin Cooke [id number] Date: 08/01/2015     Maternal Data    Feeding Feeding Type: Formula Nipple Type: Slow - flow Length of feed: 60 min  LATCH Score/Interventions                      Lactation Tools Discussed/Used     Consult Status      [place], Justine Null 08/01/2015, 10:18 PM

## 2015-08-02 ENCOUNTER — Encounter (HOSPITAL_COMMUNITY): Payer: Self-pay | Admitting: *Deleted

## 2015-08-02 ENCOUNTER — Inpatient Hospital Stay (HOSPITAL_COMMUNITY)
Admission: AD | Admit: 2015-08-02 | Discharge: 2015-08-03 | Disposition: A | Discharge status: Home / Self Care | Payer: Medicaid Other | Source: Ambulatory Visit | Attending: Obstetrics & Gynecology | Admitting: Obstetrics & Gynecology

## 2015-08-02 DIAGNOSIS — M7989 Other specified soft tissue disorders: Secondary | ICD-10-CM

## 2015-08-02 DIAGNOSIS — R600 Localized edema: Secondary | ICD-10-CM

## 2015-08-02 DIAGNOSIS — R609 Edema, unspecified: Secondary | ICD-10-CM | POA: Insufficient documentation

## 2015-08-02 DIAGNOSIS — M79672 Pain in left foot: Secondary | ICD-10-CM

## 2015-08-02 NOTE — MAU Note (Signed)
Pt reports she had a C/S on 07/07 , was discharged from hospital last pm and today has had increased swelling in her left foot. Also has pain in her left foot.

## 2015-08-03 DIAGNOSIS — R609 Edema, unspecified: Secondary | ICD-10-CM | POA: Diagnosis present

## 2015-08-03 NOTE — Discharge Instructions (Signed)
Edema °Edema is an abnormal buildup of fluids. It is more common in your legs and thighs. Painless swelling of the feet and ankles is more likely as a person ages. It also is common in looser skin, like around your eyes. °HOME CARE  °· Keep the affected body part above the level of the heart while lying down. °· Do not sit still or stand for a long time. °· Do not put anything right under your knees when you lie down. °· Do not wear tight clothes on your upper legs. °· Exercise your legs to help the puffiness (swelling) go down. °· Wear elastic bandages or support stockings as told by your doctor. °· A low-salt diet may help lessen the puffiness. °· Only take medicine as told by your doctor. °GET HELP IF: °· Treatment is not working. °· You have heart, liver, or kidney disease and notice that your skin looks puffy or shiny. °· You have puffiness in your legs that does not get better when you raise your legs. °· You have sudden weight gain for no reason. °GET HELP RIGHT AWAY IF:  °· You have shortness of breath or chest pain. °· You cannot breathe when you lie down. °· You have pain, redness, or warmth in the areas that are puffy. °· You have heart, liver, or kidney disease and get edema all of a sudden. °· You have a fever and your symptoms get worse all of a sudden. °MAKE SURE YOU:  °· Understand these instructions. °· Will watch your condition. °· Will get help right away if you are not doing well or get worse. °  °This information is not intended to replace advice given to you by your health care provider. Make sure you discuss any questions you have with your health care provider. °  °Document Released: 06/26/2007 Document Revised: 01/12/2013 Document Reviewed: 10/30/2012 °Elsevier Interactive Patient Education ©2016 [employer] Inc. ° °

## 2015-08-03 NOTE — MAU Provider Note (Signed)
  History  Robin Cooke is a 40 yo female, s/p c/s on 07/28/15, d/c'd on 08/01/15, presents unannounced to MAU after visiting infant in NICU with c/o increased swelling and pain in her left foot. Denies SOB, chest pain, redness or warmth to area of puffiness.   Patient Active Problem List   Diagnosis Date Noted  . Foot swelling 08/02/2015  . Foot pain, left 08/02/2015  . Status post repeat low transverse cesarean section 07/31/2015  . History of preterm delivery 07/31/2015    No chief complaint on file.  HPI  As above  OB History    Gravida Para Term Preterm AB TAB SAB Ectopic Multiple Living   3 2 1 1 1  0 0 1 0 1      Past Medical History  Diagnosis Date  . Gestational diabetes   . Anxiety   . Preterm labor Delivered @ 35wks    Past Surgical History  Procedure Laterality Date  . Cesarean section    . Breast surgery    . Cesarean section N/A 07/28/2015    Procedure: CESAREAN SECTION;  Surgeon: Thurnell Lose, MD;  Location: [place];  Service: Obstetrics;  Laterality: N/A;  . Bilateral salpingectomy  07/28/2015    Procedure: BILATERAL SALPINGECTOMY;  Surgeon: Thurnell Lose, MD;  Location: [place];  Service: Obstetrics;;    Family History  Problem Relation Age of Onset  . Asthma Other   . Diabetes Other   . Stroke Other   . Asthma Daughter   . Diabetes Maternal Grandmother   . Stroke Paternal Grandfather     Social History  Substance Use Topics  . Smoking status: Never Smoker   . Smokeless tobacco: None  . Alcohol Use: No    Allergies: No Known Allergies  No prescriptions prior to admission    ROS  As noted in HPI Physical Exam   Blood pressure 136/86, pulse 68, temperature 98.1 F (36.7 C), temperature source Oral, resp. rate 18, SpO2 100 %, unknown if currently breastfeeding.   Physical Exam  Gen: NAD. Lungs: CTAB. CV: RRR w/o M/R/G. Abdomen: soft, NT, no rebound or guarding. Ext: Calves 14 cm bilaterally. Foot/ankle swelling  x 2, L>R, 2+ pedal pulses, good skin turgor, less than 2 secs capillary refill to toes. Neg Homan's bilaterally, no cords or calf tenderness. No warmth or redness.  A: No evidence of DVT seen on PE.   P: [place]. Discussed normal processes in the immediate postpartum period. Encouraged LE elevation/rest as often as possible, increased hydration, exercise to legs and low-salt diet. Advised no tight clothes to upper legs. Office f/u prn for puffiness that does not improve w/ elevation, sudden weight gain for no reason, or if skin looks puffy or shiny.    Farrel Gordon CNM, MS 08/03/15, 03:55 AM

## 2015-08-16 ENCOUNTER — Ambulatory Visit: Payer: Self-pay

## 2015-08-16 NOTE — Lactation Note (Signed)
This note was copied from a baby's chart. Lactation Consultation Note  Patient Name: Robin Cooke [id number] Date: 08/16/2015 Reason for consult: Follow-up assessment;NICU baby NICU baby 51 weeks old. Assisted mom with latching baby in cross-cradle position to left breast. Baby latched deeply and suckled rhythmically with lips flanged and a few swallows noted. Demonstrated to mom how to stimulate baby to suckle. Baby nursed well for 10 minutes with good jaw excursion. Mom gave bottle after baby nursed at breast. Enc mom to pump after baby fed in the pumping room in NICU. Mom states that her DEBP was returned today, but she intends to purchase a DEBP on her way home. Discussed with mom that her breast milk supply right now is much less than the baby needs. Discussed that the baby will need to be supplemented at home as well. Mom stated that she had to do this with her first child as well. Enc mom to call for assistance as needed.   Maternal Data    Feeding Feeding Type: Breast Fed Nipple Type: Slow - flow Length of feed: 10 min  LATCH Score/Interventions Latch: Grasps breast easily, tongue down, lips flanged, rhythmical sucking.  Audible Swallowing: A few with stimulation  Type of Nipple: Everted at rest and after stimulation  Comfort (Breast/Nipple): Soft / non-tender     Hold (Positioning): Assistance needed to correctly position infant at breast and maintain latch. Intervention(s): Breastfeeding basics reviewed;Support Pillows;Position options;Skin to skin  LATCH Score: 8  Lactation Tools Discussed/Used     Consult Status Consult Status: PRN    Inocente Salles 08/16/2015, 3:26 PM

## 2015-09-05 ENCOUNTER — Inpatient Hospital Stay (HOSPITAL_COMMUNITY): Admission: AD | Admit: 2015-09-05 | Payer: Self-pay | Source: Ambulatory Visit | Admitting: Obstetrics & Gynecology

## 2015-10-10 ENCOUNTER — Emergency Department (HOSPITAL_COMMUNITY)
Admission: EM | Admit: 2015-10-10 | Discharge: 2015-10-11 | Disposition: A | Discharge status: Home / Self Care | Payer: Medicaid Other | Attending: Emergency Medicine | Admitting: Emergency Medicine

## 2015-10-10 ENCOUNTER — Emergency Department (HOSPITAL_COMMUNITY): Payer: Medicaid Other

## 2015-10-10 ENCOUNTER — Encounter (HOSPITAL_COMMUNITY): Payer: Self-pay | Admitting: Emergency Medicine

## 2015-10-10 DIAGNOSIS — K8010 Calculus of gallbladder with chronic cholecystitis without obstruction: Secondary | ICD-10-CM | POA: Insufficient documentation

## 2015-10-10 DIAGNOSIS — R935 Abnormal findings on diagnostic imaging of other abdominal regions, including retroperitoneum: Secondary | ICD-10-CM

## 2015-10-10 DIAGNOSIS — R1030 Lower abdominal pain, unspecified: Secondary | ICD-10-CM | POA: Diagnosis present

## 2015-10-10 DIAGNOSIS — Z79899 Other long term (current) drug therapy: Secondary | ICD-10-CM | POA: Insufficient documentation

## 2015-10-10 DIAGNOSIS — R109 Unspecified abdominal pain: Secondary | ICD-10-CM

## 2015-10-10 LAB — CBC
HEMATOCRIT: 44.2 % (ref 36.0–46.0)
HEMOGLOBIN: 14.6 g/dL (ref 12.0–15.0)
MCH: 26.2 pg (ref 26.0–34.0)
MCHC: 33 g/dL (ref 30.0–36.0)
MCV: 79.4 fL (ref 78.0–100.0)
Platelets: 214 10*3/uL (ref 150–400)
RBC: 5.57 MIL/uL — AB (ref 3.87–5.11)
RDW: 16.8 % — ABNORMAL HIGH (ref 11.5–15.5)
WBC: 10.2 10*3/uL (ref 4.0–10.5)

## 2015-10-10 LAB — URINALYSIS, ROUTINE W REFLEX MICROSCOPIC
BILIRUBIN URINE: NEGATIVE
Glucose, UA: NEGATIVE mg/dL
Hgb urine dipstick: NEGATIVE
Ketones, ur: NEGATIVE mg/dL
NITRITE: NEGATIVE
PH: 6.5 (ref 5.0–8.0)
Protein, ur: NEGATIVE mg/dL
SPECIFIC GRAVITY, URINE: 1.025 (ref 1.005–1.030)

## 2015-10-10 LAB — URINE MICROSCOPIC-ADD ON: RBC / HPF: NONE SEEN RBC/hpf (ref 0–5)

## 2015-10-10 LAB — COMPREHENSIVE METABOLIC PANEL
ALBUMIN: 4.6 g/dL (ref 3.5–5.0)
ALK PHOS: 105 U/L (ref 38–126)
ALT: 39 U/L (ref 14–54)
ANION GAP: 10 (ref 5–15)
AST: 54 U/L — ABNORMAL HIGH (ref 15–41)
BILIRUBIN TOTAL: 0.6 mg/dL (ref 0.3–1.2)
BUN: 10 mg/dL (ref 6–20)
CALCIUM: 8.9 mg/dL (ref 8.9–10.3)
CO2: 25 mmol/L (ref 22–32)
Chloride: 104 mmol/L (ref 101–111)
Creatinine, Ser: 0.61 mg/dL (ref 0.44–1.00)
Glucose, Bld: 93 mg/dL (ref 65–99)
POTASSIUM: 3.8 mmol/L (ref 3.5–5.1)
Sodium: 139 mmol/L (ref 135–145)
TOTAL PROTEIN: 8.6 g/dL — AB (ref 6.5–8.1)

## 2015-10-10 LAB — LIPASE, BLOOD: Lipase: 26 U/L (ref 11–51)

## 2015-10-10 MED ORDER — FENTANYL CITRATE (PF) 100 MCG/2ML IJ SOLN
50.0000 ug | Freq: Once | INTRAMUSCULAR | Status: AC
  Administered 2015-10-10: 50 ug via INTRAVENOUS
  Filled 2015-10-10: qty 2

## 2015-10-10 MED ORDER — IOPAMIDOL (ISOVUE-300) INJECTION 61%
100.0000 mL | Freq: Once | INTRAVENOUS | Status: AC | PRN
  Administered 2015-10-11: 100 mL via INTRAVENOUS

## 2015-10-10 MED ORDER — ONDANSETRON HCL 4 MG/2ML IJ SOLN
4.0000 mg | Freq: Once | INTRAMUSCULAR | Status: AC
  Administered 2015-10-10: 4 mg via INTRAVENOUS
  Filled 2015-10-10: qty 2

## 2015-10-10 MED ORDER — SODIUM CHLORIDE 0.9 % IV BOLUS (SEPSIS)
1000.0000 mL | Freq: Once | INTRAVENOUS | Status: AC
  Administered 2015-10-10: 1000 mL via INTRAVENOUS

## 2015-10-10 NOTE — ED Notes (Signed)
Bed: WTR9 Expected date:  Expected time:  Means of arrival:  Comments: 

## 2015-10-10 NOTE — ED Triage Notes (Signed)
Pt reports sudden onset of abdominal ("knots in my stomach") and back pain with multiple episodes of emesis. Pt describes lower back pain radiating down. States she had one episode of this a few weeks ago that only last about 2-3 hours. Reports this feels the same. Pt adds she has a caesarean section in July.

## 2015-10-10 NOTE — ED Notes (Signed)
Patient states she does not want her blood drawn in triage, she would like to be stuck one time if she is receiving an IV.

## 2015-10-11 ENCOUNTER — Encounter (HOSPITAL_COMMUNITY): Payer: Self-pay

## 2015-10-11 ENCOUNTER — Emergency Department (HOSPITAL_COMMUNITY): Payer: Medicaid Other

## 2015-10-11 LAB — PREGNANCY, URINE: PREG TEST UR: NEGATIVE

## 2015-10-11 MED ORDER — DICYCLOMINE HCL 20 MG PO TABS: 20.0000 mg | ORAL_TABLET | Freq: Two times a day (BID) | ORAL | 0 refills | Status: DC

## 2015-10-11 MED ORDER — CIPROFLOXACIN HCL 500 MG PO TABS: 500.0000 mg | ORAL_TABLET | Freq: Two times a day (BID) | ORAL | 0 refills | Status: DC

## 2015-10-11 MED ORDER — CIPROFLOXACIN HCL 500 MG PO TABS
500.0000 mg | ORAL_TABLET | Freq: Once | ORAL | Status: AC
  Administered 2015-10-11: 500 mg via ORAL
  Filled 2015-10-11: qty 1

## 2015-10-11 MED ORDER — METRONIDAZOLE 500 MG PO TABS: 500.0000 mg | ORAL_TABLET | Freq: Three times a day (TID) | ORAL | 0 refills | Status: DC

## 2015-10-11 MED ORDER — METRONIDAZOLE 500 MG PO TABS
500.0000 mg | ORAL_TABLET | Freq: Once | ORAL | Status: AC
  Administered 2015-10-11: 500 mg via ORAL
  Filled 2015-10-11: qty 1

## 2015-10-11 NOTE — ED Notes (Signed)
Patient transported to CT 

## 2015-10-11 NOTE — ED Provider Notes (Signed)
[facility] DEPT Provider Note   CSN: [id number] Arrival date & time: 10/10/15  2006     History   Chief Complaint Chief Complaint  Patient presents with  . Abdominal Pain    HPI Robin Cooke is a 40 y.o. female.  Lower abdominal pain radiating to the back for two weeks, not associated with any activity. Status post cesarean section in July 2017. No vaginal bleeding or discharge. No nausea, vomiting, diarrhea, fever, sweats, chills, dysuria. Last menstrual period September 9. She is eating.      Past Medical History:  Diagnosis Date  . Anxiety   . Gestational diabetes   . Preterm labor Delivered @ 35wks    Patient Active Problem List   Diagnosis Date Noted  . Foot swelling 08/02/2015  . Foot pain, left 08/02/2015  . Status post repeat low transverse cesarean section 07/31/2015  . History of preterm delivery 07/31/2015    Past Surgical History:  Procedure Laterality Date  . BILATERAL SALPINGECTOMY  07/28/2015   Procedure: BILATERAL SALPINGECTOMY;  Surgeon: Thurnell Lose, MD;  Location: [facility];  Service: Obstetrics;;  . BREAST SURGERY    . CESAREAN SECTION    . CESAREAN SECTION N/A 07/28/2015   Procedure: CESAREAN SECTION;  Surgeon: Thurnell Lose, MD;  Location: [facility];  Service: Obstetrics;  Laterality: N/A;    OB History    Gravida Para Term Preterm AB Living   3 2 1 1 1 1    SAB TAB Ectopic Multiple Live Births   0 0 1 0 1       Home Medications    Prior to Admission medications   Medication Sig Start Date End Date Taking? Authorizing Provider  pseudoephedrine (SUDAFED) 30 MG tablet Take 30 mg by mouth every 6 (six) hours as needed for congestion.   Yes Historical Provider, MD  ibuprofen (ADVIL,MOTRIN) 600 MG tablet Take 1 tablet (600 mg total) by mouth every 6 (six) hours as needed. Patient not taking: Reported on 10/10/2015 08/01/15   Everett Graff, MD  norethindrone (ORTHO MICRONOR) 0.35 MG tablet Take 1 tablet (0.35 mg  total) by mouth daily. 08/01/15   Everett Graff, MD  oxyCODONE-acetaminophen (PERCOCET/ROXICET) 5-325 MG tablet Take 1-2 tablets by mouth every 6 (six) hours as needed for moderate pain or severe pain. Patient not taking: Reported on 10/10/2015 08/01/15   Everett Graff, MD    Family History Family History  Problem Relation Age of Onset  . Asthma Other   . Diabetes Other   . Stroke Other   . Asthma Daughter   . Diabetes Maternal Grandmother   . Stroke Paternal Grandfather     Social History Social History  Substance Use Topics  . Smoking status: Never Smoker  . Smokeless tobacco: Not on file  . Alcohol use No     Allergies   Review of patient's allergies indicates no known allergies.   Review of Systems Review of Systems  All other systems reviewed and are negative.    Physical Exam Updated Vital Signs BP 108/64 (BP Location: Left Arm)   Pulse 73   Temp 98 F (36.7 C)   Resp 17   SpO2 100%   Physical Exam  Constitutional: She is oriented to person, place, and time. She appears well-developed and well-nourished.  HENT:  Head: Normocephalic and atraumatic.  Eyes: Conjunctivae are normal.  Neck: Neck supple.  Cardiovascular: Normal rate and regular rhythm.   Pulmonary/Chest: Effort normal and breath sounds normal.  Abdominal: Soft. Bowel  sounds are normal.  Minimal lower abdominal tenderness  Musculoskeletal: Normal range of motion.  Neurological: She is alert and oriented to person, place, and time.  Skin: Skin is warm and dry.  Psychiatric: She has a normal mood and affect. Her behavior is normal.  Nursing note and vitals reviewed.    ED Treatments / Results  Labs (all labs ordered are listed, but only abnormal results are displayed) Labs Reviewed  COMPREHENSIVE METABOLIC PANEL - Abnormal; Notable for the following:       Result Value   Total Protein 8.6 (*)    AST 54 (*)    All other components within normal limits  CBC - Abnormal; Notable for the  following:    RBC 5.57 (*)    RDW 16.8 (*)    All other components within normal limits  URINALYSIS, ROUTINE W REFLEX MICROSCOPIC (NOT AT [facility]) - Abnormal; Notable for the following:    Color, Urine AMBER (*)    APPearance CLOUDY (*)    Leukocytes, UA SMALL (*)    All other components within normal limits  URINE MICROSCOPIC-ADD ON - Abnormal; Notable for the following:    Squamous Epithelial / LPF 6-30 (*)    Bacteria, UA RARE (*)    All other components within normal limits  LIPASE, BLOOD  PREGNANCY, URINE    EKG  EKG Interpretation None       Radiology No results found.  Procedures Procedures (including critical care time)  Medications Ordered in ED Medications  sodium chloride 0.9 % bolus 1,000 mL (1,000 mLs Intravenous New Bag/Given 10/10/15 2301)  ondansetron (ZOFRAN) injection 4 mg (4 mg Intravenous Given 10/10/15 2301)  fentaNYL (SUBLIMAZE) injection 50 mcg (50 mcg Intravenous Given 10/10/15 2301)  iopamidol (ISOVUE-300) 61 % injection 100 mL (100 mLs Intravenous Contrast Given 10/11/15 0053)     Initial Impression / Assessment and Plan / ED Course  I have reviewed the triage vital signs and the nursing notes.  Pertinent labs & imaging results that were available during my care of the patient were reviewed by me and considered in my medical decision making (see chart for details).  Clinical Course    History and physical not consistent with any life-threatening condition. No acute abdomen. Discussed with Dr. Randal Buba.  CT abdomen/pelvis pending  Final Clinical Impressions(s) / ED Diagnoses   Final diagnoses:  Abdominal pain, unspecified abdominal location    New Prescriptions New Prescriptions   No medications on file     Nat Christen, MD 10/11/15 [id number]

## 2015-10-11 NOTE — ED Provider Notes (Signed)
Seen and examined given results of CT and ultrasound.  3 weeks of intermittent band like mid abdominal pain with vomiting.  Tonight at a hamburger prior to episode though has eaten hamburgers previously without this trouble.    Awake and alert RRR CTAB Abdomen is soft, no guarding no rebound no masses.  No murphy's sign   Patient states she has travel difficulty with small children would like surgery done now, though she is not currently in pain   430 case d/w Dr. Hassell Done, given normal labs and no murphy's sign.  Likely chronic in nature.  Pain management and bland diet and follow up in the office as an outpatient.     EDP explained that surgery would like the opportunity to see the patient in the office in order to discuss the procedure and recommend bland diet  Bland diet instructions given to the patient.  Will start cipro and flagyl.  Patient has percocet at home.  Will d/c with Bentyl and instructions to follow up with surgery.     All questions answered to patient's satisfaction. Based on history and exam patient has been appropriately medically screened and emergency conditions excluded. Patient is stable for discharge at this time. Follow up with your PMD for recheck in 2 days and strict return precautions given     Rejoice Heatwole, MD 10/11/15 [id number]

## 2015-10-13 ENCOUNTER — Ambulatory Visit: Payer: Self-pay | Admitting: Surgery

## 2015-10-13 NOTE — H&P (Signed)
Robin Cooke 10/13/2015 3:00 PM Location: [facility] Patient #: [id number] DOB: 1975/09/01 Married / Language: English / Race: Black or African American Female  History of Present Illness (Robin Cooke A. Robin Heller MD; 10/13/2015 3:28 PM) Patient words: This is a very nice 40 year old woman who is 2 months postpartum following a C-section at 33 weeks. About 3 weeks ago she had an episode of abdominal pain, nausea and emesis which seemed to resolve following the emesis. Abdominal pain seems to have been mid abdominal, as well as in the right flank and back. Again about 3 days ago she had recurrence of the same symptoms and presented to the ER where CT scan revealed cholelithiasis with small volume free pericholecystic fluid as well as diastases and fibroid uterus. On subsequent ultrasound she was found to have small stones and sludge within the gallbladder and gallbladder wall thickening and pericholecystic fluid concerning for either acute cholecystitis or chronic inflammation. Her common bile duct was 6 mm in diameter. Lipase at that time was normal and her LFTs were unremarkable, her white count was 10.2. She desires cholecystectomy as soon as possible, as she has a 13-year-old and a 75-month-old baby at home and her husband travels frequently, she is very anxious about having recurrent symptoms while caring for her 2 small children in the absence of her partner.  Prior to 3 weeks ago she had no abdominal symptoms whatsoever. She is not currently breast-feeding.  The patient is a 40 year old female.   Other Problems Robin Cooke, CMA; 10/13/2015 3:00 PM) Anxiety Disorder Arthritis Back Pain Cholelithiasis Gastroesophageal Reflux Disease Hemorrhoids  Past Surgical History Robin Cooke, CMA; 10/13/2015 3:00 PM) Breast Augmentation Bilateral. Cesarean Section - Multiple Oral Surgery  Diagnostic Studies History Robin Cooke, CMA; 10/13/2015 3:00 PM) Colonoscopy  never Mammogram never Pap Smear 1-5 years ago  Allergies Robin Cooke, CMA; 10/13/2015 3:00 PM) No Known Drug Allergies 10/13/2015  Medication History Robin Cooke, CMA; 10/13/2015 3:01 PM) Dicyclomine HCl (20MG  Tablet, Oral) Active. Ciprofloxacin HCl (500MG  Tablet, Oral) Active. MetroNIDAZOLE (500MG  Tablet, Oral) Active. Probiotic Product (Oral) Active. Medications Reconciled  Social History Robin Cooke, [place]; 10/13/2015 3:00 PM) Alcohol use Occasional alcohol use. Caffeine use Carbonated beverages, Tea. No drug use Tobacco use Never smoker.  Family History Robin Cooke, [place]; 10/13/2015 3:00 PM) Cerebrovascular Accident Family Members In General. Diabetes Mellitus Family Members In General. Migraine Headache Sister.  Pregnancy / Birth History Robin Cooke, CMA; 10/13/2015 3:00 PM) Age at menarche 55 years. Contraceptive History Oral contraceptives. Gravida 3 Length (months) of breastfeeding 3-6 Maternal age 38-25 Para 2 Regular periods     Review of Systems Robin Cooke CMA; 10/13/2015 3:00 PM) General Not Present- Appetite Loss, Chills, Fatigue, Fever, Night Sweats, Weight Gain and Weight Loss. Skin Not Present- Change in Wart/Mole, Dryness, Hives, Jaundice, New Lesions, Non-Healing Wounds, Rash and Ulcer. HEENT Present- Wears glasses/contact lenses. Not Present- Earache, Hearing Loss, Hoarseness, Nose Bleed, Oral Ulcers, Ringing in the Ears, Seasonal Allergies, Sinus Pain, Sore Throat, Visual Disturbances and Yellow Eyes. Respiratory Not Present- Bloody sputum, Chronic Cough, Difficulty Breathing, Snoring and Wheezing. Breast Not Present- Breast Mass, Breast Pain, Nipple Discharge and Skin Changes. Cardiovascular Not Present- Chest Pain, Difficulty Breathing Lying Down, Leg Cramps, Palpitations, Rapid Heart Rate, Shortness of Breath and Swelling of Extremities. Gastrointestinal Present- Abdominal Pain and Vomiting. Not Present- Bloating, Bloody Stool,  Change in Bowel Habits, Chronic diarrhea, Constipation, Difficulty Swallowing, Excessive gas, Gets full quickly at meals, Hemorrhoids, Indigestion, Nausea and Rectal Pain. Female Genitourinary Not  Present- Frequency, Nocturia, Painful Urination, Pelvic Pain and Urgency. Musculoskeletal Present- Back Pain. Not Present- Joint Pain, Joint Stiffness, Muscle Pain, Muscle Weakness and Swelling of Extremities. Neurological Not Present- Decreased Memory, Fainting, Headaches, Numbness, Seizures, Tingling, Tremor, Trouble walking and Weakness. Psychiatric Present- Anxiety and Change in Sleep Pattern. Not Present- Bipolar, Depression, Fearful and Frequent crying. Hematology Not Present- Blood Thinners, Easy Bruising, Excessive bleeding, Gland problems, HIV and Persistent Infections.  Vitals Robin Cooke CMA; 10/13/2015 3:01 PM) 10/13/2015 3:01 PM Weight: 146 lb Height: 60in Body Surface Area: 1.63 m Body Mass Index: 28.51 kg/m  Temp.: 98.55F  Pulse: 57 (Regular)  BP: 126/78 (Sitting, Left Arm, Standard)      Physical Exam (Robin Cooke A. Robin Heller MD; 10/13/2015 3:31 PM)  The physical exam findings are as follows: Note:She is alert and oriented, in no acute distress. Extraocular motion intact, pupils equally round and reactive, anicteric. Moist mucous membranes Neck is without mass or thyromegaly Unlabored respirations, clear bilaterally, regular rate and rhythm with topical distal pulses Abdomen is soft, nontender and nondistended. She does have an umbilical hernia with about a 1-1/2 cm fascial defect, reduced. Her C-section incision is healed well. Extremities are warm and well perfused no cyanosis or edema Neuro grossly intact, normal gait Psych normal mood and affect    Assessment & Plan (Robin Cooke A. Robin Heller MD; 10/13/2015 3:23 PM)  CHOLECYSTITIS (Principal Diagnosis) (K81.9) Story: Symptoms improved now on antibiotics. Plan for laparoscopic cholecystectomy. Discussed risks of surgery  including pain, scarring, bleeding, infection, injury to intraabdminal structures specifically common bile duct injury and sequelae. Wishes to proceed with surgery.  Current Plans Pt Education - Pamphlet Given - Laparoscopic Gallbladder Surgery: discussed with patient and provided information. You are being scheduled for surgery - Our schedulers will call you.  You should hear from our office's scheduling department within 5 working days about the location, date, and time of surgery. We try to make accommodations for patient's preferences in scheduling surgery, but sometimes the OR schedule or the surgeon's schedule prevents [place] from making those accommodations.  If you have not heard from our office [phone]) in 5 working days, call the office and ask for your surgeon's nurse.  If you have other questions about your diagnosis, plan, or surgery, call the office and ask for your surgeon's nurse.  Pt Education - Laparoscopic Cholecystectomy: gallbladder UMBILICAL HERNIA WITHOUT OBSTRUCTION AND WITHOUT GANGRENE (Established Diagnosis) (K42.9) Story: 1.5cm fascial defect. Will primarily repair at the time of cholecystectomy.  Current Plans Pt Education - Pamphlet Given - Hernia Surgery: discussed with patient and provided information.

## 2015-10-23 ENCOUNTER — Encounter (HOSPITAL_COMMUNITY): Payer: Self-pay | Admitting: *Deleted

## 2015-10-31 ENCOUNTER — Ambulatory Visit (HOSPITAL_COMMUNITY): Payer: Medicaid Other | Admitting: Certified Registered Nurse Anesthetist

## 2015-10-31 ENCOUNTER — Ambulatory Visit (HOSPITAL_COMMUNITY)
Admission: RE | Admit: 2015-10-31 | Discharge: 2015-10-31 | Disposition: A | Discharge status: Home / Self Care | Payer: Medicaid Other | Source: Ambulatory Visit | Attending: Surgery | Admitting: Surgery

## 2015-10-31 ENCOUNTER — Encounter (HOSPITAL_COMMUNITY): Payer: Self-pay | Admitting: Certified Registered Nurse Anesthetist

## 2015-10-31 ENCOUNTER — Encounter (HOSPITAL_COMMUNITY)
Admission: RE | Disposition: A | Discharge status: Home / Self Care | Payer: Self-pay | Source: Ambulatory Visit | Attending: Surgery

## 2015-10-31 DIAGNOSIS — K8042 Calculus of bile duct with acute cholecystitis without obstruction: Secondary | ICD-10-CM | POA: Diagnosis not present

## 2015-10-31 DIAGNOSIS — Z79899 Other long term (current) drug therapy: Secondary | ICD-10-CM | POA: Diagnosis not present

## 2015-10-31 DIAGNOSIS — K810 Acute cholecystitis: Secondary | ICD-10-CM | POA: Diagnosis present

## 2015-10-31 HISTORY — DX: Calculus of gallbladder without cholecystitis without obstruction: K80.20

## 2015-10-31 HISTORY — DX: Candidiasis, unspecified: B37.9

## 2015-10-31 HISTORY — DX: Unspecified osteoarthritis, unspecified site: M19.90

## 2015-10-31 HISTORY — PX: CHOLECYSTECTOMY: SHX55

## 2015-10-31 HISTORY — PX: UMBILICAL HERNIA REPAIR: SHX196

## 2015-10-31 HISTORY — DX: Personal history of urinary (tract) infections: Z87.440

## 2015-10-31 LAB — PREGNANCY, URINE: Preg Test, Ur: NEGATIVE

## 2015-10-31 SURGERY — LAPAROSCOPIC CHOLECYSTECTOMY
Anesthesia: General

## 2015-10-31 MED ORDER — SUGAMMADEX SODIUM 200 MG/2ML IV SOLN
INTRAVENOUS | Status: AC
  Filled 2015-10-31: qty 4

## 2015-10-31 MED ORDER — NEOSTIGMINE METHYLSULFATE 5 MG/5ML IV SOSY
PREFILLED_SYRINGE | INTRAVENOUS | Status: AC
  Filled 2015-10-31: qty 5

## 2015-10-31 MED ORDER — MIDAZOLAM HCL 2 MG/2ML IJ SOLN
INTRAMUSCULAR | Status: AC
  Filled 2015-10-31: qty 2

## 2015-10-31 MED ORDER — BUPIVACAINE HCL (PF) 0.5 % IJ SOLN
INTRAMUSCULAR | Status: AC
  Filled 2015-10-31: qty 30

## 2015-10-31 MED ORDER — PROPOFOL 10 MG/ML IV BOLUS
INTRAVENOUS | Status: DC | PRN
  Administered 2015-10-31: 150 mg via INTRAVENOUS

## 2015-10-31 MED ORDER — FENTANYL CITRATE (PF) 100 MCG/2ML IJ SOLN
INTRAMUSCULAR | Status: AC
  Filled 2015-10-31: qty 4

## 2015-10-31 MED ORDER — CHLORHEXIDINE GLUCONATE CLOTH 2 % EX PADS: 6.0000 | MEDICATED_PAD | Freq: Once | CUTANEOUS | Status: DC

## 2015-10-31 MED ORDER — IOPAMIDOL (ISOVUE-300) INJECTION 61%
INTRAVENOUS | Status: AC
  Filled 2015-10-31: qty 50

## 2015-10-31 MED ORDER — ROCURONIUM BROMIDE 100 MG/10ML IV SOLN
INTRAVENOUS | Status: DC | PRN
  Administered 2015-10-31: 10 mg via INTRAVENOUS
  Administered 2015-10-31: 30 mg via INTRAVENOUS

## 2015-10-31 MED ORDER — LIDOCAINE 2% (20 MG/ML) 5 ML SYRINGE
INTRAMUSCULAR | Status: AC
  Filled 2015-10-31: qty 5

## 2015-10-31 MED ORDER — HEPARIN SODIUM (PORCINE) 5000 UNIT/ML IJ SOLN
5000.0000 [IU] | Freq: Once | INTRAMUSCULAR | Status: DC
Start: 2015-10-31 — End: 2015-10-31
  Filled 2015-10-31: qty 1

## 2015-10-31 MED ORDER — BUPIVACAINE HCL 0.5 % IJ SOLN
INTRAMUSCULAR | Status: DC | PRN
  Administered 2015-10-31: 30 mL

## 2015-10-31 MED ORDER — PROPOFOL 10 MG/ML IV BOLUS
INTRAVENOUS | Status: AC
  Filled 2015-10-31: qty 40

## 2015-10-31 MED ORDER — LIDOCAINE HCL (CARDIAC) 20 MG/ML IV SOLN
INTRAVENOUS | Status: DC | PRN
  Administered 2015-10-31: 80 mg via INTRAVENOUS

## 2015-10-31 MED ORDER — PROMETHAZINE HCL 25 MG/ML IJ SOLN
6.2500 mg | INTRAMUSCULAR | Status: DC | PRN
  Administered 2015-10-31: 6.25 mg via INTRAVENOUS
  Filled 2015-10-31: qty 1

## 2015-10-31 MED ORDER — CEFAZOLIN SODIUM-DEXTROSE 2-4 GM/100ML-% IV SOLN
2.0000 g | INTRAVENOUS | Status: AC
  Administered 2015-10-31: 2 g via INTRAVENOUS

## 2015-10-31 MED ORDER — OXYCODONE-ACETAMINOPHEN 5-325 MG PO TABS: 1.0000 | ORAL_TABLET | Freq: Four times a day (QID) | ORAL | 0 refills | Status: DC | PRN

## 2015-10-31 MED ORDER — LABETALOL HCL 5 MG/ML IV SOLN
INTRAVENOUS | Status: DC | PRN
  Administered 2015-10-31 (×3): 2.5 mg via INTRAVENOUS

## 2015-10-31 MED ORDER — LACTATED RINGERS IV SOLN
INTRAVENOUS | Status: DC | PRN
  Administered 2015-10-31 (×2): via INTRAVENOUS

## 2015-10-31 MED ORDER — GLYCOPYRROLATE 0.2 MG/ML IV SOSY
PREFILLED_SYRINGE | INTRAVENOUS | Status: AC
  Filled 2015-10-31: qty 3

## 2015-10-31 MED ORDER — FENTANYL CITRATE (PF) 100 MCG/2ML IJ SOLN: 25.0000 ug | INTRAMUSCULAR | Status: DC | PRN

## 2015-10-31 MED ORDER — ROCURONIUM BROMIDE 10 MG/ML (PF) SYRINGE
PREFILLED_SYRINGE | INTRAVENOUS | Status: AC
  Filled 2015-10-31: qty 10

## 2015-10-31 MED ORDER — FENTANYL CITRATE (PF) 100 MCG/2ML IJ SOLN
INTRAMUSCULAR | Status: AC
  Filled 2015-10-31: qty 2

## 2015-10-31 MED ORDER — ONDANSETRON HCL 4 MG/2ML IJ SOLN
INTRAMUSCULAR | Status: DC | PRN
  Administered 2015-10-31 (×2): 2 mg via INTRAVENOUS

## 2015-10-31 MED ORDER — DOCUSATE SODIUM 100 MG PO CAPS: 100.0000 mg | ORAL_CAPSULE | Freq: Two times a day (BID) | ORAL | 0 refills | Status: AC

## 2015-10-31 MED ORDER — FENTANYL CITRATE (PF) 100 MCG/2ML IJ SOLN
INTRAMUSCULAR | Status: DC | PRN
  Administered 2015-10-31 (×4): 50 ug via INTRAVENOUS

## 2015-10-31 MED ORDER — GLYCOPYRROLATE 0.2 MG/ML IJ SOLN
INTRAMUSCULAR | Status: DC | PRN
  Administered 2015-10-31: 0.6 mg via INTRAVENOUS

## 2015-10-31 MED ORDER — OXYCODONE-ACETAMINOPHEN 5-325 MG PO TABS
1.0000 | ORAL_TABLET | Freq: Four times a day (QID) | ORAL | Status: DC | PRN
  Administered 2015-10-31: 1 via ORAL
  Filled 2015-10-31: qty 1

## 2015-10-31 MED ORDER — PROPOFOL 10 MG/ML IV BOLUS
INTRAVENOUS | Status: AC
  Filled 2015-10-31: qty 20

## 2015-10-31 MED ORDER — ONDANSETRON HCL 4 MG/2ML IJ SOLN
INTRAMUSCULAR | Status: AC
  Filled 2015-10-31: qty 2

## 2015-10-31 MED ORDER — NEOSTIGMINE METHYLSULFATE 10 MG/10ML IV SOLN
INTRAVENOUS | Status: DC | PRN
  Administered 2015-10-31: 4 mg via INTRAVENOUS

## 2015-10-31 MED ORDER — CEFAZOLIN SODIUM-DEXTROSE 2-4 GM/100ML-% IV SOLN
INTRAVENOUS | Status: AC
  Filled 2015-10-31: qty 100

## 2015-10-31 MED ORDER — DEXAMETHASONE SODIUM PHOSPHATE 10 MG/ML IJ SOLN
INTRAMUSCULAR | Status: DC | PRN
  Administered 2015-10-31: 10 mg via INTRAVENOUS

## 2015-10-31 MED ORDER — PROPOFOL 500 MG/50ML IV EMUL
INTRAVENOUS | Status: DC | PRN
  Administered 2015-10-31: 50 ug/kg/min via INTRAVENOUS

## 2015-10-31 MED ORDER — MIDAZOLAM HCL 5 MG/5ML IJ SOLN
INTRAMUSCULAR | Status: DC | PRN
  Administered 2015-10-31 (×2): 0.5 mg via INTRAVENOUS

## 2015-10-31 MED ORDER — LACTATED RINGERS IR SOLN
Status: DC | PRN
  Administered 2015-10-31: 1000 mL

## 2015-10-31 MED ORDER — DEXAMETHASONE SODIUM PHOSPHATE 10 MG/ML IJ SOLN
INTRAMUSCULAR | Status: AC
  Filled 2015-10-31: qty 1

## 2015-10-31 SURGICAL SUPPLY — 48 items
ADH SKN CLS APL DERMABOND .7 (GAUZE/BANDAGES/DRESSINGS) ×1
APL SKNCLS STERI-STRIP NONHPOA (GAUZE/BANDAGES/DRESSINGS)
APPLIER CLIP 5 13 M/L LIGAMAX5 (MISCELLANEOUS) ×6
APR CLP MED LRG 5 ANG JAW (MISCELLANEOUS) ×2
BAG SPEC RTRVL LRG 6X4 10 (ENDOMECHANICALS) ×1
BALL CTTN LRG ABS STRL LF (GAUZE/BANDAGES/DRESSINGS) ×1
BENZOIN TINCTURE PRP APPL 2/3 (GAUZE/BANDAGES/DRESSINGS) IMPLANT
BNDG ADH 5X2 AIR PERM ELC (GAUZE/BANDAGES/DRESSINGS)
BNDG COHESIVE 2X5 WHT NS (GAUZE/BANDAGES/DRESSINGS) IMPLANT
CHLORAPREP W/TINT 26ML (MISCELLANEOUS) ×3 IMPLANT
CLIP APPLIE 5 13 M/L LIGAMAX5 (MISCELLANEOUS) ×2 IMPLANT
CLOSURE WOUND 1/2 X4 (GAUZE/BANDAGES/DRESSINGS)
COTTONBALL LRG STERILE PKG (GAUZE/BANDAGES/DRESSINGS) ×3 IMPLANT
COVER MAYO STAND STRL (DRAPES) IMPLANT
COVER SURGICAL LIGHT HANDLE (MISCELLANEOUS) ×3 IMPLANT
DECANTER SPIKE VIAL GLASS SM (MISCELLANEOUS) IMPLANT
DERMABOND ADVANCED (GAUZE/BANDAGES/DRESSINGS) ×2
DERMABOND ADVANCED .7 DNX12 (GAUZE/BANDAGES/DRESSINGS) ×1 IMPLANT
DRAPE C-ARM 42X120 X-RAY (DRAPES) IMPLANT
DRAPE UTILITY XL STRL (DRAPES) IMPLANT
DRSG TEGADERM 2-3/8X2-3/4 SM (GAUZE/BANDAGES/DRESSINGS) IMPLANT
ELECT PENCIL ROCKER SW 15FT (MISCELLANEOUS) ×3 IMPLANT
ELECT REM PT RETURN 9FT ADLT (ELECTROSURGICAL) ×3
ELECTRODE REM PT RTRN 9FT ADLT (ELECTROSURGICAL) ×1 IMPLANT
GLOVE BIOGEL PI IND STRL 7.0 (GLOVE) ×1 IMPLANT
GLOVE BIOGEL PI INDICATOR 7.0 (GLOVE) ×2
GLOVE ECLIPSE 8.0 STRL XLNG CF (GLOVE) IMPLANT
GLOVE INDICATOR 8.0 STRL GRN (GLOVE) IMPLANT
GOWN STRL REUS W/TWL LRG LVL3 (GOWN DISPOSABLE) ×3 IMPLANT
GOWN STRL REUS W/TWL XL LVL3 (GOWN DISPOSABLE) ×6 IMPLANT
HEMOSTAT SURGICEL 4X8 (HEMOSTASIS) IMPLANT
IRRIG SUCT STRYKERFLOW 2 WTIP (MISCELLANEOUS) ×3
IRRIGATION SUCT STRKRFLW 2 WTP (MISCELLANEOUS) ×1 IMPLANT
IV CATH 14GX2 1/4 (CATHETERS) IMPLANT
KIT BASIN OR (CUSTOM PROCEDURE TRAY) ×3 IMPLANT
NS IRRIG 1000ML POUR BTL (IV SOLUTION) ×3 IMPLANT
PAD POSITIONING PINK XL (MISCELLANEOUS) IMPLANT
POSITIONER SURGICAL ARM (MISCELLANEOUS) IMPLANT
POUCH SPECIMEN RETRIEVAL 10MM (ENDOMECHANICALS) ×3 IMPLANT
STRIP CLOSURE SKIN 1/2X4 (GAUZE/BANDAGES/DRESSINGS) IMPLANT
SUT ETHIBOND 0 (SUTURE) ×9 IMPLANT
SUT MNCRL AB 4-0 PS2 18 (SUTURE) ×3 IMPLANT
TAPE CLOTH 4X10 WHT NS (GAUZE/BANDAGES/DRESSINGS) IMPLANT
TOWEL OR 17X26 10 PK STRL BLUE (TOWEL DISPOSABLE) IMPLANT
TRAY LAPAROSCOPIC (CUSTOM PROCEDURE TRAY) ×3 IMPLANT
TROCAR XCEL BLUNT TIP 100MML (ENDOMECHANICALS) ×3 IMPLANT
TROCAR XCEL NON-BLD 11X100MML (ENDOMECHANICALS) ×3 IMPLANT
TUBING INSUF HEATED (TUBING) IMPLANT

## 2015-10-31 NOTE — Anesthesia Postprocedure Evaluation (Signed)
Anesthesia Post Note  Patient: Robin Cooke  Procedure(s) Performed: Procedure(s) (LRB): LAPAROSCOPIC CHOLECYSTECTOMY (N/A) LAPAROSCOPIC UMBILICAL HERNIA (N/A)  Patient location during evaluation: PACU Anesthesia Type: General Level of consciousness: awake and alert Pain management: pain level controlled Vital Signs Assessment: post-procedure vital signs reviewed and stable Respiratory status: spontaneous breathing, nonlabored ventilation, respiratory function stable and patient connected to nasal cannula oxygen Cardiovascular status: blood pressure returned to baseline and stable Postop Assessment: no signs of nausea or vomiting Anesthetic complications: no    Last Vitals:  Vitals:   10/31/15 1030 10/31/15 1048  BP: (!) 123/94 (!) 133/94  Pulse: 63 83  Resp: 17 16  Temp: 37.2 C 37.2 C    Last Pain:  Vitals:   10/31/15 1122  TempSrc:   PainSc: 4                  Macie Baum J

## 2015-10-31 NOTE — Discharge Instructions (Signed)
[facility] ______[facility] LAPAROSCOPIC SURGERY: POST OP INSTRUCTIONS Always review your discharge instruction sheet given to you by the facility where your surgery was performed. IF YOU HAVE DISABILITY OR FAMILY LEAVE FORMS, YOU MUST BRING THEM TO THE OFFICE FOR PROCESSING.   DO NOT GIVE THEM TO YOUR DOCTOR.  1. A prescription for pain medication may be given to you upon discharge.  Take your pain medication as prescribed, if needed.  If narcotic pain medicine is not needed, then you may take acetaminophen (Tylenol) or ibuprofen (Advil) as needed. Take stool softeners while using narcotic pain medications and be sure to drink 64oz of water daily.  2. Take your usually prescribed medications unless otherwise directed. 3. If you need a refill on your pain medication, please contact your pharmacy.  They will contact our office to request authorization. Prescriptions will not be filled after 5pm or on week-ends. 4. You should follow a light diet the first few days after arrival home, such as soup and crackers, etc.  Be sure to include lots of fluids daily. 5. Most patients will experience some swelling and bruising in the area of the incisions.  Ice packs will help.  Swelling and bruising can take several days to resolve.  6. It is common to experience some constipation if taking pain medication after surgery.  Increasing fluid intake and taking a stool softener (such as Colace) will usually help or prevent this problem from occurring.  A mild laxative (Milk of Magnesia or Miralax) should be taken according to package instructions if there are no bowel movements after 48 hours. 7. Unless discharge instructions indicate otherwise, you may remove your bandages 24-48 hours after surgery, and you may shower at that time.  You may have steri-strips (small skin tapes) in place directly over the incision.  These strips should be left on the skin for 7-10 days.  If your surgeon used skin glue on the  incision, you may shower in 24 hours.  The glue will flake off over the next 2-3 weeks.  Any sutures or staples will be removed at the office during your follow-up visit. 8. ACTIVITIES:  You may resume regular (light) daily activities beginning the next day--such as daily self-care, walking, climbing stairs--gradually increasing activities as tolerated.  You may have sexual intercourse when it is comfortable.  Refrain from any heavy lifting or straining until approved by your doctor. a. You may drive when you are no longer taking prescription pain medication, you can comfortably wear a seatbelt, and you can safely maneuver your car and apply brakes. b. RETURN TO WORK:  __________________________________________________________ 9. You should see your doctor in the office for a follow-up appointment approximately 2-3 weeks after your surgery.  Make sure that you call for this appointment within a day or two after you arrive home to insure a convenient appointment time. 10. OTHER INSTRUCTIONS: __________________________________________________________________________________________________________________________ __________________________________________________________________________________________________________________________ WHEN TO CALL YOUR DOCTOR: 1. Fever over 101.0 2. Inability to urinate 3. Continued bleeding from incision. 4. Increased pain, redness, or drainage from the incision. 5. Increasing abdominal pain  The clinic staff is available to answer your questions during regular business hours.  Please dont hesitate to call and ask to speak to one of the nurses for clinical concerns.  If you have a medical emergency, go to the nearest emergency room or call 911.  A surgeon from [facility] is always on call at the hospital. [address], [place], [place], [place]  [address] ?  [id number], [place], [place]   [address] [phone] ? [phone] ? [phone] [phone]  [phone] Web site: [email or web address]     General Anesthesia, Adult, Care After Refer to this sheet in the next few weeks. These instructions provide you with information on caring for yourself after your procedure. Your health care provider may also give you more specific instructions. Your treatment has been planned according to current medical practices, but problems sometimes occur. Call your health care provider if you have any problems or questions after your procedure. WHAT TO EXPECT AFTER THE PROCEDURE After the procedure, it is typical to experience:  Sleepiness.  Nausea and vomiting. HOME CARE INSTRUCTIONS  For the first 24 hours after general anesthesia:  Have a responsible person with you.  Do not drive a car. If you are alone, do not take public transportation.  Do not drink alcohol.  Do not take medicine that has not been prescribed by your health care provider.  Do not sign important papers or make important decisions.  You may resume a normal diet and activities as directed by your health care provider.  Change bandages (dressings) as directed.  If you have questions or problems that seem related to general anesthesia, call the hospital and ask for the anesthetist or anesthesiologist on call. SEEK MEDICAL CARE IF:  You have nausea and vomiting that continue the day after anesthesia.  You develop a rash. SEEK IMMEDIATE MEDICAL CARE IF:   You have difficulty breathing.  You have chest pain.  You have any allergic problems.   This information is not intended to replace advice given to you by your health care provider. Make sure you discuss any questions you have with your health care provider.   Document Released: 04/15/2000 Document Revised: 01/28/2014 Document Reviewed: 05/08/2011 Elsevier Interactive Patient Education [employer].

## 2015-10-31 NOTE — Anesthesia Preprocedure Evaluation (Addendum)
Anesthesia Evaluation  Patient identified by MRN, date of birth, ID band Patient awake    Reviewed: Allergy & Precautions, NPO status , Patient's Chart, lab work & pertinent test results  Airway Mallampati: II  TM Distance: >3 FB Neck ROM: Full    Dental  (+)    Pulmonary neg pulmonary ROS,    Pulmonary exam normal breath sounds clear to auscultation       Cardiovascular negative cardio ROS Normal cardiovascular exam Rhythm:Regular Rate:Normal     Neuro/Psych Anxiety negative neurological ROS     GI/Hepatic Neg liver ROS, GERD  ,  Endo/Other  negative endocrine ROSdiabetes, Gestational  Renal/GU negative Renal ROS  negative genitourinary   Musculoskeletal  (+) Arthritis ,   Abdominal   Peds negative pediatric ROS (+)  Hematology negative hematology ROS (+)   Anesthesia Other Findings   Reproductive/Obstetrics negative OB ROS Negative pregnancy test.                           Anesthesia Physical Anesthesia Plan  ASA: II  Anesthesia Plan: General   Post-op Pain Management:    Induction: Intravenous  Airway Management Planned: Oral ETT  Additional Equipment:   Intra-op Plan:   Post-operative Plan: Extubation in OR  Informed Consent: I have reviewed the patients History and Physical, chart, labs and discussed the procedure including the risks, benefits and alternatives for the proposed anesthesia with the patient or authorized representative who has indicated his/her understanding and acceptance.   Dental advisory given  Plan Discussed with: CRNA  Anesthesia Plan Comments:         Anesthesia Quick Evaluation

## 2015-10-31 NOTE — Interval H&P Note (Signed)
History and Physical Interval Note:  10/31/2015 6:49 AM  Robin Cooke  has presented today for surgery, with the diagnosis of Gallstones, umbilical hernia  The various methods of treatment have been discussed with the patient and family. After consideration of risks, benefits and other options for treatment, the patient has consented to  Laparoscopic cholecystectomy with primary repair of umbilical hernia as a surgical intervention .  The patient's history has been reviewed, patient examined, no change in status, stable for surgery.  I have reviewed the patient's chart and labs.  Questions were answered to the patient's satisfaction.     Faith Branan Rich Brave

## 2015-10-31 NOTE — Op Note (Signed)
Operative Note  Robin Cooke 40 y.o. female [id number]  10/31/2015  Surgeon: Clovis Riley   Assistant: Dr. Dalbert Batman  Procedure performed: Laparoscopic Cholecystectomy, primary repair of umbilical hernia  Preop diagnosis: biliary colic", acute cholecystitis, umbilical hernia  Post-op diagnosis/intraop findings: same  Specimens: gallbladder  EBL: 5cc  Complications: none  Description of procedure: After obtaining informed consent the patient was brought to the operating room. Prophylactic antibiotics and subcutaneous heparin were administered. SCD's were applied. General endotracheal anesthesia was initiated and a formal time-out was performed. The abdomen was prepped and draped in the usual sterile fashion and the abdomen was entered using a hassan technique via the umbilicus, using the umbilical hernia as our entry point after instilling the site with local. Insufflation to 61mmHg was obtained and gross inspection revealed no evidence of injury from our entry or other intraabdominal abnormalities. Three 46mm trocars were introduced in the subxyphoid, right midclavicular and right anterior axillary lines under direct visualization and following infiltration with local. The gallbladder was retracted cephalad and the infundibulum was retracted laterally. A combination of hook electrocautery and blunt dissection was utilized to clear the peritoneum from the neck and cystic duct, circumferentially isolating the cystic artery and cystic duct and lifting the gallbladder from the cystic plate. The critical view of safety was achieved with the cystic artery, cystic duct, and liver bed visualized between them with no other structures. The artery was clipped with a single clip proximally and distally and divided as was the cystic duct with two clips on the proximal end. The gallbladder was dissected from the liver plate using electrocautery. Once freed the gallbladder was placed in an endocatch bag and  removed through the umbilical trocar site. A small amount of bleeding on the liver bed was controlled with cautery. Some bile had been spilled from the gallbladder during its dissection from the liver bed. This was aspirated and the right upper quadrant was irrigated copiously until the effluent was clear. Hemostasis was once again confirmed, and reinspection of the abdomen revealed no injuries. The clips were well opposed without any bile leak from the duct or the liver bed. The abdomen was desufflated and all trocars removed. The umbilical skin was dissected free of the remaining hernia sac and the fascia was identified and reapproximated with interrupted figure-of-eight sutures of 0-ethibond. The umbilical skin was then tacked back down to the fascia with a monocryl suture. The skin incisions were closed with running subcuticular monocryl and dermabond. A pressure dressing was applied to the umbilicus. The patient was awakened, extubated and transported to the recovery room in stable condition.   All counts were correct at the completion of the case.

## 2015-10-31 NOTE — Transfer of Care (Signed)
Immediate Anesthesia Transfer of Care Note  Patient: Sinthia Piotter  Procedure(s) Performed: Procedure(s): LAPAROSCOPIC CHOLECYSTECTOMY (N/A) LAPAROSCOPIC UMBILICAL HERNIA (N/A)  Patient Location: PACU  Anesthesia Type:General  Level of Consciousness: awake, oriented, patient cooperative, lethargic and responds to stimulation  Airway & Oxygen Therapy: Patient Spontanous Breathing and Patient connected to face mask oxygen  Post-op Assessment: Report given to RN, Post -op Vital signs reviewed and stable and Patient moving all extremities  Post vital signs: Reviewed and stable  Last Vitals:  Vitals:   10/31/15 0541  BP: (!) 108/44  Pulse: 75  Resp: 16  Temp: 36.5 C    Last Pain:  Vitals:   10/31/15 0541  TempSrc: Oral         Complications: No apparent anesthesia complications

## 2015-10-31 NOTE — H&P (View-Only) (Signed)
Robin Cooke 10/13/2015 3:00 PM Location: [facility] Patient #: [id number] DOB: 1975/02/29 Married / Language: English / Race: Black or African American Female  History of Present Illness (Chelsea A. [name] MD; 10/13/2015 3:28 PM) Patient words: This is a very nice 41 year old woman who is 2 months postpartum following a C-section at 33 weeks. About 3 weeks ago she had an episode of abdominal pain, nausea and emesis which seemed to resolve following the emesis. Abdominal pain seems to have been mid abdominal, as well as in the right flank and back. Again about 3 days ago she had recurrence of the same symptoms and presented to the ER where CT scan revealed cholelithiasis with small volume free pericholecystic fluid as well as diastases and fibroid uterus. On subsequent ultrasound she was found to have small stones and sludge within the gallbladder and gallbladder wall thickening and pericholecystic fluid concerning for either acute cholecystitis or chronic inflammation. Her common bile duct was 6 mm in diameter. Lipase at that time was normal and her LFTs were unremarkable, her white count was 10.2. She desires cholecystectomy as soon as possible, as she has a 44-year-old and a 24-month-old baby at home and her husband travels frequently, she is very anxious about having recurrent symptoms while caring for her 2 small children in the absence of her partner.  Prior to 3 weeks ago she had no abdominal symptoms whatsoever. She is not currently breast-feeding.  The patient is a 40 year old female.   Other Problems Elbert Ewings, CMA; 10/13/2015 3:00 PM) Anxiety Disorder Arthritis Back Pain Cholelithiasis Gastroesophageal Reflux Disease Hemorrhoids  Past Surgical History Elbert Ewings, CMA; 10/13/2015 3:00 PM) Breast Augmentation Bilateral. Cesarean Section - Multiple Oral Surgery  Diagnostic Studies History Elbert Ewings, CMA; 10/13/2015 3:00 PM) Colonoscopy  never Mammogram never Pap Smear 1-5 years ago  Allergies Elbert Ewings, CMA; 10/13/2015 3:00 PM) No Known Drug Allergies 10/13/2015  Medication History Elbert Ewings, CMA; 10/13/2015 3:01 PM) Dicyclomine HCl (20MG  Tablet, Oral) Active. Ciprofloxacin HCl (500MG  Tablet, Oral) Active. MetroNIDAZOLE (500MG  Tablet, Oral) Active. Probiotic Product (Oral) Active. Medications Reconciled  Social History Elbert Ewings, [place]; 10/13/2015 3:00 PM) Alcohol use Occasional alcohol use. Caffeine use Carbonated beverages, Tea. No drug use Tobacco use Never smoker.  Family History Elbert Ewings, [place]; 10/13/2015 3:00 PM) Cerebrovascular Accident Family Members In General. Diabetes Mellitus Family Members In General. Migraine Headache Sister.  Pregnancy / Birth History Elbert Ewings, CMA; 10/13/2015 3:00 PM) Age at menarche 78 years. Contraceptive History Oral contraceptives. Gravida 3 Length (months) of breastfeeding 3-6 Maternal age 18-25 Para 2 Regular periods     Review of Systems Elbert Ewings CMA; 10/13/2015 3:00 PM) General Not Present- Appetite Loss, Chills, Fatigue, Fever, Night Sweats, Weight Gain and Weight Loss. Skin Not Present- Change in Wart/Mole, Dryness, Hives, Jaundice, New Lesions, Non-Healing Wounds, Rash and Ulcer. HEENT Present- Wears glasses/contact lenses. Not Present- Earache, Hearing Loss, Hoarseness, Nose Bleed, Oral Ulcers, Ringing in the Ears, Seasonal Allergies, Sinus Pain, Sore Throat, Visual Disturbances and Yellow Eyes. Respiratory Not Present- Bloody sputum, Chronic Cough, Difficulty Breathing, Snoring and Wheezing. Breast Not Present- Breast Mass, Breast Pain, Nipple Discharge and Skin Changes. Cardiovascular Not Present- Chest Pain, Difficulty Breathing Lying Down, Leg Cramps, Palpitations, Rapid Heart Rate, Shortness of Breath and Swelling of Extremities. Gastrointestinal Present- Abdominal Pain and Vomiting. Not Present- Bloating, Bloody Stool,  Change in Bowel Habits, Chronic diarrhea, Constipation, Difficulty Swallowing, Excessive gas, Gets full quickly at meals, Hemorrhoids, Indigestion, Nausea and Rectal Pain. Female Genitourinary Not  Present- Frequency, Nocturia, Painful Urination, Pelvic Pain and Urgency. Musculoskeletal Present- Back Pain. Not Present- Joint Pain, Joint Stiffness, Muscle Pain, Muscle Weakness and Swelling of Extremities. Neurological Not Present- Decreased Memory, Fainting, Headaches, Numbness, Seizures, Tingling, Tremor, Trouble walking and Weakness. Psychiatric Present- Anxiety and Change in Sleep Pattern. Not Present- Bipolar, Depression, Fearful and Frequent crying. Hematology Not Present- Blood Thinners, Easy Bruising, Excessive bleeding, Gland problems, HIV and Persistent Infections.  Vitals Elbert Ewings CMA; 10/13/2015 3:01 PM) 10/13/2015 3:01 PM Weight: 146 lb Height: 60in Body Surface Area: 1.63 m Body Mass Index: 28.51 kg/m  Temp.: 98.61F  Pulse: 57 (Regular)  BP: 126/78 (Sitting, Left Arm, Standard)      Physical Exam (Chelsea A. [name] MD; 10/13/2015 3:31 PM)  The physical exam findings are as follows: Note:She is alert and oriented, in no acute distress. Extraocular motion intact, pupils equally round and reactive, anicteric. Moist mucous membranes Neck is without mass or thyromegaly Unlabored respirations, clear bilaterally, regular rate and rhythm with topical distal pulses Abdomen is soft, nontender and nondistended. She does have an umbilical hernia with about a 1-1/2 cm fascial defect, reduced. Her C-section incision is healed well. Extremities are warm and well perfused no cyanosis or edema Neuro grossly intact, normal gait Psych normal mood and affect    Assessment & Plan (Chelsea A. [name] MD; 10/13/2015 3:23 PM)  CHOLECYSTITIS (Principal Diagnosis) (K81.9) Story: Symptoms improved now on antibiotics. Plan for laparoscopic cholecystectomy. Discussed risks of surgery  including pain, scarring, bleeding, infection, injury to intraabdminal structures specifically common bile duct injury and sequelae. Wishes to proceed with surgery.  Current Plans Pt Education - Pamphlet Given - Laparoscopic Gallbladder Surgery: discussed with patient and provided information. You are being scheduled for surgery - Our schedulers will call you.  You should hear from our office's scheduling department within 5 working days about the location, date, and time of surgery. We try to make accommodations for patient's preferences in scheduling surgery, but sometimes the OR schedule or the surgeon's schedule prevents [place] from making those accommodations.  If you have not heard from our office [phone]) in 5 working days, call the office and ask for your surgeon's nurse.  If you have other questions about your diagnosis, plan, or surgery, call the office and ask for your surgeon's nurse.  Pt Education - Laparoscopic Cholecystectomy: gallbladder UMBILICAL HERNIA WITHOUT OBSTRUCTION AND WITHOUT GANGRENE (Established Diagnosis) (K42.9) Story: 1.5cm fascial defect. Will primarily repair at the time of cholecystectomy.  Current Plans Pt Education - Pamphlet Given - Hernia Surgery: discussed with patient and provided information.

## 2015-10-31 NOTE — Anesthesia Procedure Notes (Signed)
Procedure Name: Intubation Date/Time: 10/31/2015 7:33 AM Performed by: Franne Grip Pre-anesthesia Checklist: Patient identified, Timeout performed, Emergency Drugs available, Suction available and Patient being monitored Patient Re-evaluated:Patient Re-evaluated prior to inductionOxygen Delivery Method: Circle system utilized Preoxygenation: Pre-oxygenation with 100% oxygen Intubation Type: IV induction and Cricoid Pressure applied Ventilation: Mask ventilation without difficulty Laryngoscope Size: Mac and 4 Grade View: Grade III Tube type: Oral Tube size: 7.5 mm Number of attempts: 1 Airway Equipment and Method: Stylet Placement Confirmation: breath sounds checked- equal and bilateral and positive ETCO2 Secured at: 22 cm Tube secured with: Tape Dental Injury: Teeth and Oropharynx as per pre-operative assessment  Difficulty Due To: Difficulty was anticipated, Difficult Airway- due to large tongue, Difficult Airway- due to dentition and Difficult Airway- due to anterior larynx Future Recommendations: Recommend- induction with short-acting agent, and alternative techniques readily available Comments: glidescope standby.  Anterior. Arytenoids visualized with head lift/cricoid pressure.

## 2017-07-23 ENCOUNTER — Other Ambulatory Visit: Payer: Self-pay | Admitting: Obstetrics & Gynecology

## 2017-08-14 ENCOUNTER — Other Ambulatory Visit: Payer: Self-pay

## 2017-08-14 ENCOUNTER — Encounter (HOSPITAL_COMMUNITY): Payer: Self-pay | Admitting: *Deleted

## 2017-08-22 ENCOUNTER — Other Ambulatory Visit: Payer: Self-pay | Admitting: Obstetrics & Gynecology

## 2017-09-02 NOTE — H&P (Signed)
Quetzali Heinle is an 42 y.o. female. P2 with heavy irregular periods and found to have fibroids, here for D & C hysteroscopy woth Myosure as well as endometrial ablation via Novasure.   Pertinent Gynecological History: Menses: heavy periods Contraception: OCP (estrogen/progesterone) DES exposure: unknown Blood transfusions: none Sexually transmitted diseases: no past history Previous GYN Procedures: None  Last mammogram: None Last pap: normal Date: 09/16/16 OB History: G3, P1112   Menstrual History: Patient's last menstrual period was 06/14/2017 (approximate).    Past Medical History:  Diagnosis Date  . Anxiety    no meds  . Arthritis    hands  . Gallstones   . Gestational diabetes    with both preg, resolved after deliveries  . History of urinary tract infection   . Preterm labor Delivered @ 35wks  . Yeast infection     Past Surgical History:  Procedure Laterality Date  . BILATERAL SALPINGECTOMY  07/28/2015   Procedure: BILATERAL SALPINGECTOMY;  Surgeon: Thurnell Lose, MD;  Location: [place];  Service: Obstetrics;;  . BREAST SURGERY     augmentation  . CARPAL TUNNEL RELEASE     left wrist  . CESAREAN SECTION    . CESAREAN SECTION N/A 07/28/2015   Procedure: CESAREAN SECTION;  Surgeon: Thurnell Lose, MD;  Location: [place];  Service: Obstetrics;  Laterality: N/A;  . CHOLECYSTECTOMY N/A 10/31/2015   Procedure: LAPAROSCOPIC CHOLECYSTECTOMY;  Surgeon: Clovis Riley, MD;  Location: [facility];  Service: General;  Laterality: N/A;  . tummy tuck    . UMBILICAL HERNIA REPAIR N/A 10/31/2015   Procedure: LAPAROSCOPIC UMBILICAL HERNIA;  Surgeon: Clovis Riley, MD;  Location: [facility];  Service: General;  Laterality: N/A;    Family History  Problem Relation Age of Onset  . Asthma Other   . Diabetes Other   . Stroke Other   . Asthma Daughter   . Diabetes Maternal Grandmother   . Stroke Paternal Grandfather     Social History:  reports that she has  never smoked. She has never used smokeless tobacco. She reports that she drinks alcohol. She reports that she does not use drugs.  Allergies: No Known Allergies  No medications prior to admission.    ROS Constitutional: Denies fevers/chills Cardiovascular: Denies chest pain or palpitations Pulmonary: Denies coughing or wheezing Gastrointestinal: Denies nausea, vomiting or diarrhea Genitourinary: Denies pelvic pain, unusual vaginal bleeding, unusual vaginal discharge, dysuria, urgency or frequency.  Musculoskeletal: Denies muscle or joint aches and pain.  Neurology: Denies abnormal sensations such as tingling or numbness.   Height 5' 0.5" (1.537 m), weight 68 kg, last menstrual period 06/14/2017. Physical Exam Blood pressure 111/90, pulse 83, temperature 97.9 F (36.6 C), temperature source Oral, resp. rate 16, height 5' 0.5" (1.537 m), weight 68 kg, last menstrual period 06/14/2017, SpO2 100 %. Constitutional: She is oriented to person, place, and time. She appears well-developed and well-nourished.  HENT:  Head: Normocephalic and atraumatic.  Neck: Normal range of motion.  Cardiovascular: Normal rate, regular rhythm and normal heart sounds.   Respiratory: Effort normal and breath sounds normal.  GI: Soft. Bowel sounds are normal.  Neurological: She is alert and oriented to person, place, and time.  Skin: Skin is warm and dry.  Psychiatric: She has a normal mood and affect. Her behavior is normal.  No results found for this or any previous visit (from the past 24 hour(s)).  No results found. CBC    Component Value Date/Time   WBC 10.2 10/10/2015 2248  RBC 5.57 (H) 10/10/2015 2248   HGB 14.6 10/10/2015 2248   HCT 44.2 10/10/2015 2248   PLT 214 10/10/2015 2248   MCV 79.4 10/10/2015 2248   MCH 26.2 10/10/2015 2248   MCHC 33.0 10/10/2015 2248   RDW 16.8 (H) 10/10/2015 2248   CBC    Component Value Date/Time   WBC 7.2 09/03/2017 0726   RBC 4.99 09/03/2017 0726   HGB  13.8 09/03/2017 0726   HCT 41.3 09/03/2017 0726   PLT 191 09/03/2017 0726   MCV 82.8 09/03/2017 0726   MCH 27.7 09/03/2017 0726   MCHC 33.4 09/03/2017 0726   RDW 14.7 09/03/2017 0726   09/03/17: Urine pregnancy test: Negative.   Pelvic US 08/02/17: Uterus with multiple fibroids, at least 5 measured 1.8 cm to 3.59 cm intramural, subserosal and submucosal.  Submucosal fibroid is 1.8cm x 1.4 cm. .  Uterus length 7.25 cm. Endometrium 5.1 x 3.2 cm.  Normal left and right ovaries and adnexa bilaterally.    Assessment/Plan: 42 y.o. female. P2 with heavy irregular periods and found to have fibroids, here for D & C hysteroscopy woth Myosure as well as endometrial ablation via Novasure to decrease her menstrual flow.   These procedures has been fully reviewed with the patient and written informed consent has been obtained. Discussed risks of bleeding, infection, damage to organs. Discussed risks of posttubal-ablation syndrome and patient expressed understanding.  All her questions were answered and she was consented for surgery.   -NPO, IV fluids  Alinda Dooms, MD.  09/02/2017, 6:38 PM

## 2017-09-02 NOTE — Anesthesia Preprocedure Evaluation (Signed)
Anesthesia Evaluation  Patient identified by MRN, date of birth, ID band Patient awake    Reviewed: Allergy & Precautions, NPO status , Patient's Chart, lab work & pertinent test results  Airway Mallampati: II  TM Distance: >3 FB Neck ROM: Full    Dental  (+)    Pulmonary neg pulmonary ROS,    Pulmonary exam normal breath sounds clear to auscultation       Cardiovascular negative cardio ROS Normal cardiovascular exam Rhythm:Regular Rate:Normal     Neuro/Psych Anxiety negative neurological ROS     GI/Hepatic Neg liver ROS, GERD  ,  Endo/Other  negative endocrine ROSdiabetes, Gestational  Renal/GU negative Renal ROS  negative genitourinary   Musculoskeletal  (+) Arthritis ,   Abdominal   Peds negative pediatric ROS (+)  Hematology negative hematology ROS (+)   Anesthesia Other Findings   Reproductive/Obstetrics negative OB ROS Negative pregnancy test.                             Anesthesia Physical  Anesthesia Plan  ASA: II  Anesthesia Plan: General   Post-op Pain Management:    Induction: Intravenous  PONV Risk Score and Plan: 2 and Ondansetron, Dexamethasone and Treatment may vary due to age or medical condition  Airway Management Planned: LMA  Additional Equipment:   Intra-op Plan:   Post-operative Plan: Extubation in OR  Informed Consent: I have reviewed the patients History and Physical, chart, labs and discussed the procedure including the risks, benefits and alternatives for the proposed anesthesia with the patient or authorized representative who has indicated his/her understanding and acceptance.   Dental advisory given  Plan Discussed with: CRNA, Anesthesiologist and Surgeon  Anesthesia Plan Comments: ( )        Anesthesia Quick Evaluation

## 2017-09-03 ENCOUNTER — Other Ambulatory Visit: Payer: Self-pay

## 2017-09-03 ENCOUNTER — Encounter (HOSPITAL_COMMUNITY): Payer: Self-pay | Admitting: *Deleted

## 2017-09-03 ENCOUNTER — Ambulatory Visit (HOSPITAL_COMMUNITY): Payer: Medicaid Other | Admitting: Anesthesiology

## 2017-09-03 ENCOUNTER — Encounter (HOSPITAL_COMMUNITY)
Admission: AD | Disposition: A | Discharge status: Home / Self Care | Payer: Self-pay | Source: Ambulatory Visit | Attending: Obstetrics & Gynecology

## 2017-09-03 ENCOUNTER — Ambulatory Visit (HOSPITAL_COMMUNITY)
Admission: AD | Admit: 2017-09-03 | Discharge: 2017-09-03 | Disposition: A | Discharge status: Home / Self Care | Payer: Medicaid Other | Source: Ambulatory Visit | Attending: Obstetrics & Gynecology | Admitting: Obstetrics & Gynecology

## 2017-09-03 DIAGNOSIS — Z823 Family history of stroke: Secondary | ICD-10-CM | POA: Insufficient documentation

## 2017-09-03 DIAGNOSIS — Z79899 Other long term (current) drug therapy: Secondary | ICD-10-CM | POA: Insufficient documentation

## 2017-09-03 DIAGNOSIS — Z9079 Acquired absence of other genital organ(s): Secondary | ICD-10-CM | POA: Diagnosis not present

## 2017-09-03 DIAGNOSIS — N921 Excessive and frequent menstruation with irregular cycle: Secondary | ICD-10-CM | POA: Diagnosis not present

## 2017-09-03 DIAGNOSIS — Z793 Long term (current) use of hormonal contraceptives: Secondary | ICD-10-CM | POA: Diagnosis not present

## 2017-09-03 DIAGNOSIS — D250 Submucous leiomyoma of uterus: Secondary | ICD-10-CM | POA: Diagnosis present

## 2017-09-03 DIAGNOSIS — F419 Anxiety disorder, unspecified: Secondary | ICD-10-CM | POA: Diagnosis not present

## 2017-09-03 HISTORY — PX: HYSTEROSCOPY WITH NOVASURE: SHX5574

## 2017-09-03 HISTORY — PX: DILATATION & CURETTAGE/HYSTEROSCOPY WITH MYOSURE: SHX6511

## 2017-09-03 LAB — CBC
HCT: 41.3 % (ref 36.0–46.0)
HEMOGLOBIN: 13.8 g/dL (ref 12.0–15.0)
MCH: 27.7 pg (ref 26.0–34.0)
MCHC: 33.4 g/dL (ref 30.0–36.0)
MCV: 82.8 fL (ref 78.0–100.0)
Platelets: 191 10*3/uL (ref 150–400)
RBC: 4.99 MIL/uL (ref 3.87–5.11)
RDW: 14.7 % (ref 11.5–15.5)
WBC: 7.2 10*3/uL (ref 4.0–10.5)

## 2017-09-03 LAB — TYPE AND SCREEN
ABO/RH(D): O POS
Antibody Screen: NEGATIVE

## 2017-09-03 LAB — PREGNANCY, URINE: PREG TEST UR: NEGATIVE

## 2017-09-03 SURGERY — DILATATION & CURETTAGE/HYSTEROSCOPY WITH MYOSURE
Anesthesia: General | Site: Vagina

## 2017-09-03 MED ORDER — MEPERIDINE HCL 25 MG/ML IJ SOLN: 6.2500 mg | INTRAMUSCULAR | Status: DC | PRN

## 2017-09-03 MED ORDER — DEXAMETHASONE SODIUM PHOSPHATE 10 MG/ML IJ SOLN
INTRAMUSCULAR | Status: DC | PRN
  Administered 2017-09-03: 4 mg via INTRAVENOUS

## 2017-09-03 MED ORDER — SODIUM CHLORIDE 0.9 % IR SOLN
Status: DC | PRN
  Administered 2017-09-03: 3000 mL

## 2017-09-03 MED ORDER — SILVER NITRATE-POT NITRATE 75-25 % EX MISC
CUTANEOUS | Status: DC | PRN
  Administered 2017-09-03: 5

## 2017-09-03 MED ORDER — PROPOFOL 10 MG/ML IV BOLUS
INTRAVENOUS | Status: DC | PRN
  Administered 2017-09-03: 180 mg via INTRAVENOUS

## 2017-09-03 MED ORDER — ONDANSETRON HCL 4 MG/2ML IJ SOLN
INTRAMUSCULAR | Status: AC
  Filled 2017-09-03: qty 2

## 2017-09-03 MED ORDER — OXYCODONE HCL 5 MG PO TABS
5.0000 mg | ORAL_TABLET | Freq: Once | ORAL | Status: AC | PRN
  Administered 2017-09-03: 5 mg via ORAL

## 2017-09-03 MED ORDER — MIDAZOLAM HCL 2 MG/2ML IJ SOLN
INTRAMUSCULAR | Status: AC
  Filled 2017-09-03: qty 2

## 2017-09-03 MED ORDER — ACETAMINOPHEN 160 MG/5ML PO SOLN: 325.0000 mg | ORAL | Status: DC | PRN

## 2017-09-03 MED ORDER — OXYCODONE-ACETAMINOPHEN 5-325 MG PO TABS: 1.0000 | ORAL_TABLET | ORAL | Status: AC | PRN

## 2017-09-03 MED ORDER — BUPIVACAINE-EPINEPHRINE (PF) 0.5% -1:200000 IJ SOLN
INTRAMUSCULAR | Status: AC
  Filled 2017-09-03: qty 30

## 2017-09-03 MED ORDER — KETOROLAC TROMETHAMINE 30 MG/ML IJ SOLN
INTRAMUSCULAR | Status: AC
  Filled 2017-09-03: qty 1

## 2017-09-03 MED ORDER — PROPOFOL 10 MG/ML IV BOLUS
INTRAVENOUS | Status: AC
  Filled 2017-09-03: qty 20

## 2017-09-03 MED ORDER — OXYCODONE HCL 5 MG PO TABS
ORAL_TABLET | ORAL | Status: AC
  Administered 2017-09-03: 5 mg via ORAL
  Filled 2017-09-03: qty 1

## 2017-09-03 MED ORDER — ONDANSETRON HCL 4 MG/2ML IJ SOLN: 4.0000 mg | Freq: Once | INTRAMUSCULAR | Status: DC | PRN

## 2017-09-03 MED ORDER — FENTANYL CITRATE (PF) 100 MCG/2ML IJ SOLN: 25.0000 ug | INTRAMUSCULAR | Status: DC | PRN

## 2017-09-03 MED ORDER — OXYCODONE HCL 5 MG/5ML PO SOLN: 5.0000 mg | Freq: Once | ORAL | Status: AC | PRN

## 2017-09-03 MED ORDER — ATROPINE SULFATE 1 MG/10ML IJ SOSY
PREFILLED_SYRINGE | INTRAMUSCULAR | Status: AC
  Filled 2017-09-03: qty 10

## 2017-09-03 MED ORDER — ACETAMINOPHEN 325 MG PO TABS: 325.0000 mg | ORAL_TABLET | ORAL | Status: DC | PRN

## 2017-09-03 MED ORDER — LIDOCAINE HCL (CARDIAC) PF 100 MG/5ML IV SOSY
PREFILLED_SYRINGE | INTRAVENOUS | Status: DC | PRN
  Administered 2017-09-03: 80 mg via INTRAVENOUS

## 2017-09-03 MED ORDER — SCOPOLAMINE 1 MG/3DAYS TD PT72
1.0000 | MEDICATED_PATCH | Freq: Once | TRANSDERMAL | Status: DC
  Administered 2017-09-03: 1.5 mg via TRANSDERMAL

## 2017-09-03 MED ORDER — FENTANYL CITRATE (PF) 250 MCG/5ML IJ SOLN
INTRAMUSCULAR | Status: AC
  Filled 2017-09-03: qty 5

## 2017-09-03 MED ORDER — ONDANSETRON HCL 4 MG/2ML IJ SOLN
INTRAMUSCULAR | Status: DC | PRN
  Administered 2017-09-03: 4 mg via INTRAVENOUS

## 2017-09-03 MED ORDER — MIDAZOLAM HCL 2 MG/2ML IJ SOLN
INTRAMUSCULAR | Status: DC | PRN
  Administered 2017-09-03: 1 mg via INTRAVENOUS

## 2017-09-03 MED ORDER — KETOROLAC TROMETHAMINE 30 MG/ML IJ SOLN
INTRAMUSCULAR | Status: DC | PRN
  Administered 2017-09-03: 30 mg via INTRAVENOUS

## 2017-09-03 MED ORDER — BUPIVACAINE-EPINEPHRINE 0.5% -1:200000 IJ SOLN
INTRAMUSCULAR | Status: DC | PRN
  Administered 2017-09-03: 10 mL

## 2017-09-03 MED ORDER — LACTATED RINGERS IV SOLN
INTRAVENOUS | Status: DC
  Administered 2017-09-03 (×2): via INTRAVENOUS

## 2017-09-03 MED ORDER — SCOPOLAMINE 1 MG/3DAYS TD PT72
MEDICATED_PATCH | TRANSDERMAL | Status: AC
  Administered 2017-09-03: 1.5 mg via TRANSDERMAL
  Filled 2017-09-03: qty 1

## 2017-09-03 MED ORDER — FENTANYL CITRATE (PF) 100 MCG/2ML IJ SOLN
INTRAMUSCULAR | Status: DC | PRN
  Administered 2017-09-03 (×2): 50 ug via INTRAVENOUS

## 2017-09-03 MED ORDER — IBUPROFEN 800 MG PO TABS: 800.0000 mg | ORAL_TABLET | Freq: Three times a day (TID) | ORAL | 0 refills | Status: AC | PRN

## 2017-09-03 MED ORDER — LIDOCAINE HCL (CARDIAC) PF 100 MG/5ML IV SOSY
PREFILLED_SYRINGE | INTRAVENOUS | Status: AC
  Filled 2017-09-03: qty 5

## 2017-09-03 MED ORDER — DEXAMETHASONE SODIUM PHOSPHATE 4 MG/ML IJ SOLN
INTRAMUSCULAR | Status: AC
  Filled 2017-09-03: qty 1

## 2017-09-03 SURGICAL SUPPLY — 15 items
ABLATOR SURESOUND NOVASURE (ABLATOR) ×3 IMPLANT
CANISTER SUCT 3000ML PPV (MISCELLANEOUS) ×3 IMPLANT
CATH ROBINSON RED A/P 16FR (CATHETERS) ×3 IMPLANT
DEVICE MYOSURE LITE (MISCELLANEOUS) IMPLANT
DEVICE MYOSURE REACH (MISCELLANEOUS) ×3 IMPLANT
GLOVE BIOGEL PI IND STRL 7.0 (GLOVE) ×2 IMPLANT
GLOVE BIOGEL PI INDICATOR 7.0 (GLOVE) ×4
GLOVE SURG SS PI 6.5 STRL IVOR (GLOVE) ×3 IMPLANT
GOWN STRL REUS W/TWL LRG LVL3 (GOWN DISPOSABLE) ×6 IMPLANT
PACK VAGINAL MINOR WOMEN LF (CUSTOM PROCEDURE TRAY) ×3 IMPLANT
PAD OB MATERNITY 4.3X12.25 (PERSONAL CARE ITEMS) ×3 IMPLANT
SEAL ROD LENS SCOPE MYOSURE (ABLATOR) ×3 IMPLANT
TOWEL OR 17X24 6PK STRL BLUE (TOWEL DISPOSABLE) ×6 IMPLANT
TUBING AQUILEX INFLOW (TUBING) ×3 IMPLANT
TUBING AQUILEX OUTFLOW (TUBING) ×3 IMPLANT

## 2017-09-03 NOTE — Transfer of Care (Signed)
Immediate Anesthesia Transfer of Care Note  Patient: Robin Cooke  Procedure(s) Performed: DILATATION & CURETTAGE/HYSTEROSCOPY WITH MYOSURE (N/A Vagina ) HYSTEROSCOPY WITH NOVASURE (N/A Vagina )  Patient Location: PACU  Anesthesia Type:General  Level of Consciousness: sedated  Airway & Oxygen Therapy: Patient Spontanous Breathing and Patient connected to nasal cannula oxygen  Post-op Assessment: Report given to RN  Post vital signs: Reviewed and stable  Last Vitals:  Vitals Value Taken Time  BP 113/79 09/03/2017  9:45 AM  Temp    Pulse 79 09/03/2017  9:46 AM  Resp 13 09/03/2017  9:46 AM  SpO2 100 % 09/03/2017  9:46 AM  Vitals shown include unvalidated device data.  Last Pain:  Vitals:   09/03/17 0730  TempSrc: Oral  PainSc: 0-No pain      Patients Stated Pain Goal: 3 (92/33/00 7622)  Complications: No apparent anesthesia complications

## 2017-09-03 NOTE — Interval H&P Note (Signed)
History and Physical Interval Note:  09/03/2017 8:27 AM  Robin Cooke  has presented today for surgery, with the diagnosis of Irregular Periods  The various methods of treatment have been discussed with the patient and family. After consideration of risks, benefits and other options for treatment, the patient has consented to  Procedure(s): [place] (N/A) HYSTEROSCOPY WITH NOVASURE (N/A) as a surgical intervention .  The patient's history has been reviewed, patient examined, no change in status, stable for surgery.  I have reviewed the patient's chart and labs.  Questions were answered to the patient's satisfaction.     Alinda Dooms, MD.

## 2017-09-03 NOTE — Anesthesia Procedure Notes (Addendum)
Procedure Name: LMA Insertion Date/Time: 09/03/2017 8:36 AM Performed by: Asher Muir, CRNA Pre-anesthesia Checklist: Patient being monitored, Patient identified, Emergency Drugs available and Suction available Patient Re-evaluated:Patient Re-evaluated prior to induction Oxygen Delivery Method: Circle system utilized Preoxygenation: Pre-oxygenation with 100% oxygen Induction Type: IV induction and Inhalational induction Ventilation: Mask ventilation without difficulty LMA: LMA inserted LMA Size: 4.0 Number of attempts: 1 Dental Injury: Teeth and Oropharynx as per pre-operative assessment

## 2017-09-03 NOTE — Anesthesia Postprocedure Evaluation (Signed)
Anesthesia Post Note  Patient: Suprina Mandeville  Procedure(s) Performed: DILATATION & CURETTAGE/HYSTEROSCOPY WITH MYOSURE (N/A Vagina ) HYSTEROSCOPY WITH NOVASURE (N/A Vagina )     Patient location during evaluation: PACU Anesthesia Type: General Level of consciousness: awake and alert Pain management: pain level controlled Vital Signs Assessment: post-procedure vital signs reviewed and stable Respiratory status: spontaneous breathing, nonlabored ventilation, respiratory function stable and patient connected to nasal cannula oxygen Cardiovascular status: blood pressure returned to baseline and stable Postop Assessment: no apparent nausea or vomiting Anesthetic complications: no    Last Vitals:  Vitals:   09/03/17 0940 09/03/17 0945  BP: 113/69 113/79  Pulse: 88 72  Resp: 18 13  Temp: 36.6 C   SpO2: 100% 100%    Last Pain:  Vitals:   09/03/17 0945  TempSrc:   PainSc: 0-No pain   Pain Goal: Patients Stated Pain Goal: 3 (09/03/17 0730)               Travante Knee

## 2017-09-03 NOTE — Discharge Instructions (Signed)

## 2017-09-03 NOTE — Addendum Note (Signed)
Addendum  created 09/03/17 1059 by Asher Muir, CRNA   Intraprocedure Event edited, Intraprocedure Flowsheets edited

## 2017-09-03 NOTE — Op Note (Signed)
Robin Cooke, [name] Female, 43 y.o., 02/20/1975  PREOP DIAGNOSIS:  1.Menorrhagia 2. Fibroid uterus, small submucosal fibroid      Post operative diagnosis: Same as above  Procedures: Dilation and Currettage hysteroscopy, Myosure and Novasure ablation procedures.   Surgeon: Dr. Waymon Amato  Assistant: [place]  Anesthesiologist: General anesthesia- LMA  Complications: Per anesthesia- post-op laryngospasm upon extubation.   IV fluid: per anesthesia  Fluid deficit: 200 cc  Urine: 10 cc straight catheterization  EBL: 20 cc  Indications: 42 year old s/p bilateral tubal ligation with a history of heavy periods and fibroid uterus unresponsive to medical management.  Patient had an ultrasound that showed 1.8cm submucosal fibroid and other small intramural and subserosal fibroids.  An office biopsy had been normal.  She was taken to the operating room for removal of endometrial lesions as well as Novasure ablation.      Procedure:  Informed consent was obtained from the patient to undergo the procedure including risks of bleeding, infection, damage to organs, post-tubal ablation syndrome.  She was taken to the operating room and anesthesia was administered without difficulty. An exam was then performed under anesthesia revealing a small anteverted uterus and a closed cervix. There were no palpable adnexal masses.  She was prepped and draped in the usual sterile fashion. She was straight catheterized.  A graves speculum was used to view the cervix. Single-tooth tenaculum was placed anteriorly on the cervix. 10 cc of marcaine with epinephrine instilled as a paracervical block.  The cervix was dilated to #6 Hegar dilators.  Myosure diagnostic scope was then placed to view the uterine cavity and uterus was noted to have thick fluffy endometrial tissue as well as small fibroid like lesion on the posterior uterine wall.  The thick endometrial tissue as well as fibroid looking lesion were  shaved off using Myosure REACH under direct visualization without any complications. She tolerated the procedure well. The tubal ostia were noted during the procedure.  No other lesions were noted. The myosure was removed and endocervical and endometrial curettings obtained.  The Novasure ablation was done in the usual fashion.  Cavity length was assessed to be 6cm (uterine sounder measured cervix at 5cm and total sounding length was 11cm). Cavity width was assessed to be 3.6 cm.  Novasure procedure performed in 70 seconds after confirmation of seal, power achieved was 119 watts.  The Novasure device was then removed and repeat hysteroscopy performed with endometrial tissue noted to be blanched.  Hysteroscope was removed and silver nitrate applied to tenaculum sites bilaterally with good hemostasis noted.  Remaining instruments were then removed.  The patient was awoken from anesthesia and was noted to have temporary laryngospasm upon extubation that anesthesia managed.  She was then taken to the PACU in stable condition  Specimen: Endometrial shavings and currettings, endocervical currettings.   Disposition: Stable to PACU.

## 2017-09-03 NOTE — Brief Op Note (Signed)
09/03/2017  9:39 AM  PATIENT:  Robin Cooke  42 y.o. female  PRE-OPERATIVE DIAGNOSIS:  Menorrhagia, Submucosal fibroid.   POST-OPERATIVE DIAGNOSIS:  Menorrhagia, Submucosal fibroid.   PROCEDURE:  Procedure(s): DILATATION & CURETTAGE/HYSTEROSCOPY WITH MYOSURE (N/A) HYSTEROSCOPY WITH NOVASURE (N/A)  SURGEON:  Surgeon(s) and Role:    * Waymon Amato, MD - Primary  ANESTHESIA:   general  EBL:  20 mL   BLOOD ADMINISTERED:none  DRAINS: none   LOCAL MEDICATIONS USED:  MARCAINE with epinephrine, Amount: 10 ml  SPECIMEN:  Source of Specimen:  Endometrial lesion and currettings, endocervical currettings.   DISPOSITION OF SPECIMEN:  PATHOLOGY  COUNTS:  YES  TOURNIQUET:  * No tourniquets in log *  DICTATION: .Note written in [place]: Discharge to home after PACU  PATIENT DISPOSITION:  PACU - hemodynamically stable.   Delay start of Pharmacological VTE agent (>24hrs) due to surgical blood loss or risk of bleeding: not applicable

## 2017-09-04 ENCOUNTER — Encounter (HOSPITAL_COMMUNITY): Payer: Self-pay | Admitting: Obstetrics & Gynecology

## 2017-10-20 IMAGING — CT CT ABD-PELV W/ CM
2 of 4 series · 15 of 46 positions shown, 17 images · IV contrast (ISOVUE)
Comparison: None available.

CLINICAL DATA: Initial evaluation for acute lower abdominal pain,
radiating to back.

EXAM:
CT ABDOMEN AND PELVIS WITH CONTRAST
TECHNIQUE: Multidetector CT imaging of the abdomen and pelvis was performed
using the standard protocol following bolus administration of
intravenous contrast.

[Series 2: abd/pel with · axial · 0.62mm/px · z∈[+1147,+1532]mm · 12 of 87 slices shown, 14 images]
[im 5/87  soft-tissue]
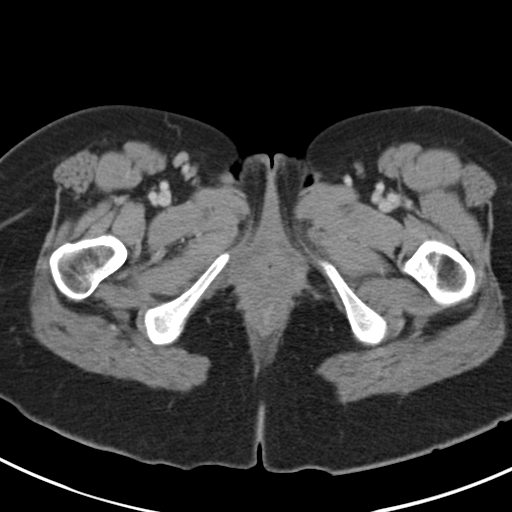
[im 5/87  bone]
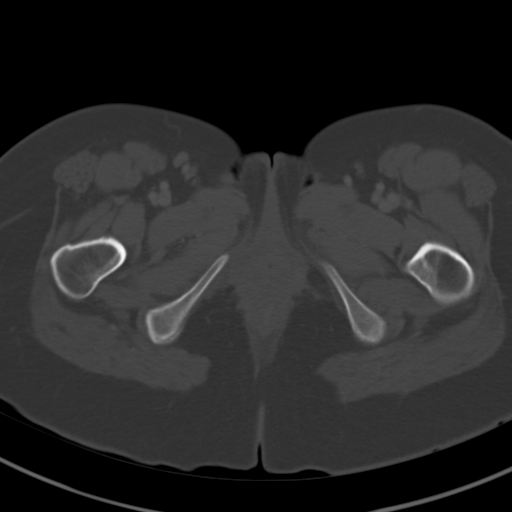
[im 13/87  soft-tissue]
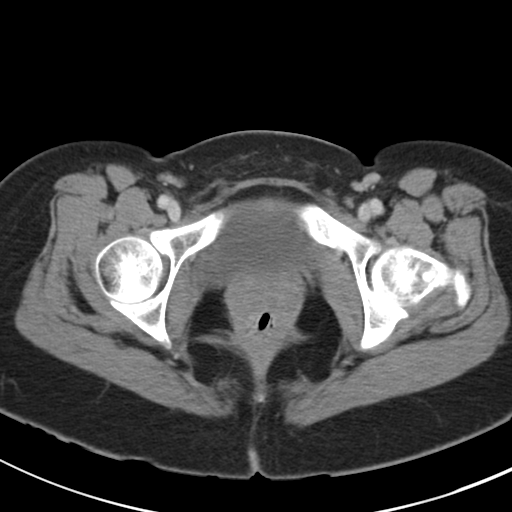
[im 18/87  soft-tissue]
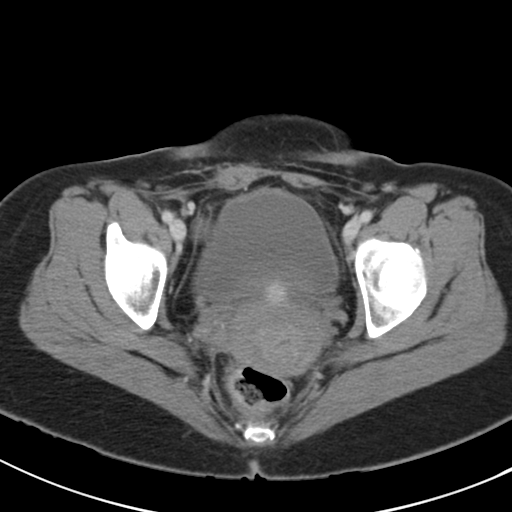
[im 26/87  soft-tissue]
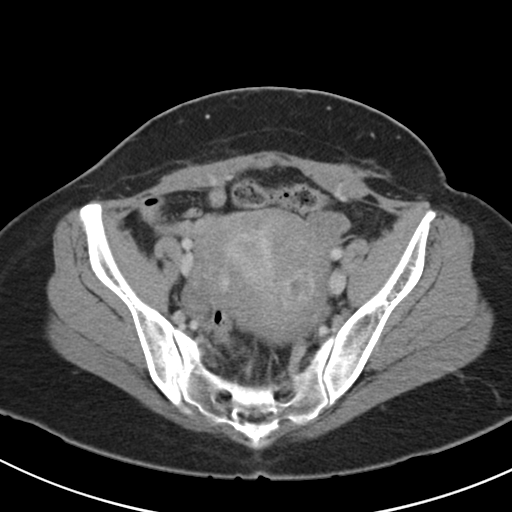
[im 35/87  soft-tissue]
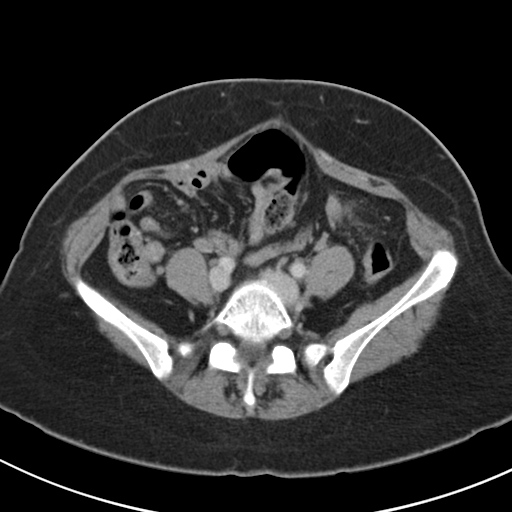
[im 39/87  soft-tissue]
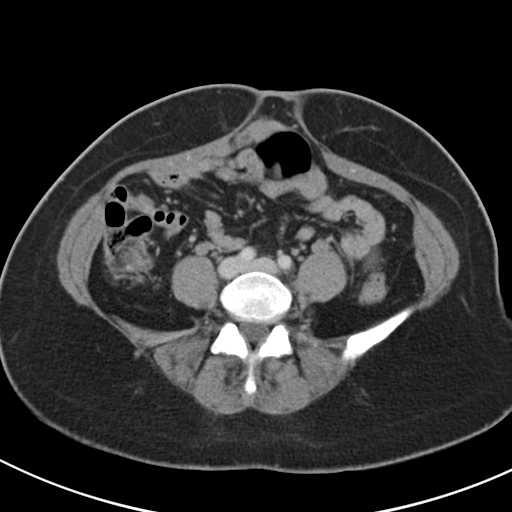
[im 48/87  soft-tissue]
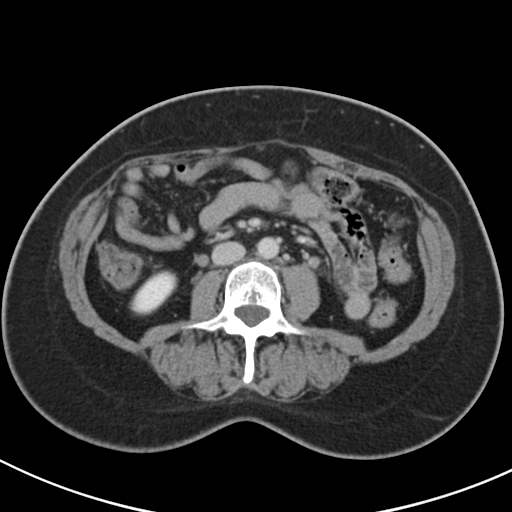
[im 52/87  soft-tissue]
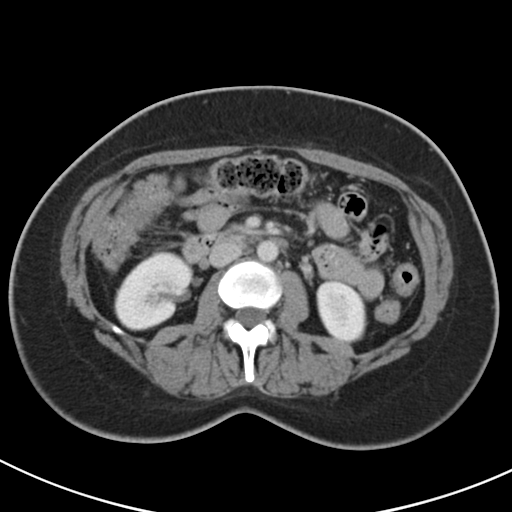
[im 61/87  soft-tissue]
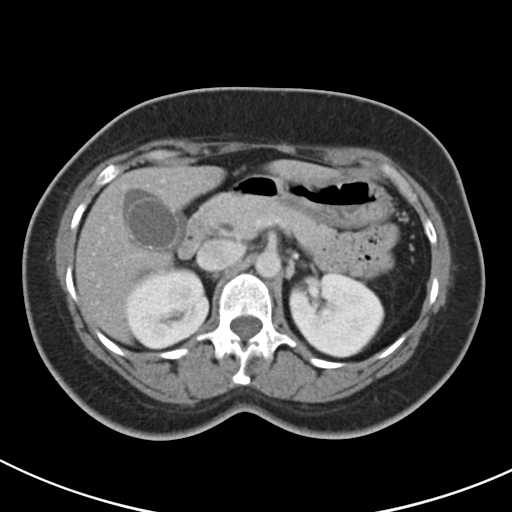
[im 61/87  bone]
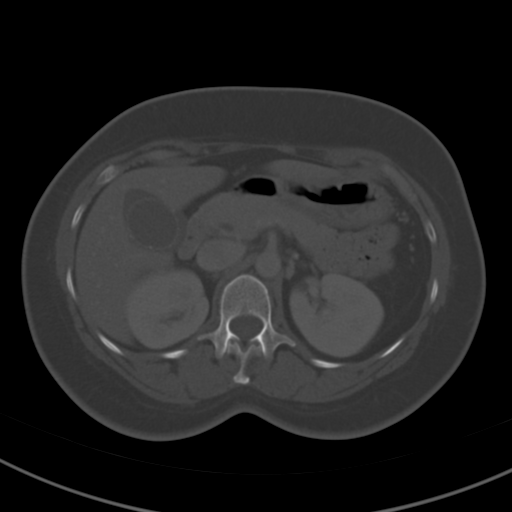
[im 69/87  soft-tissue]
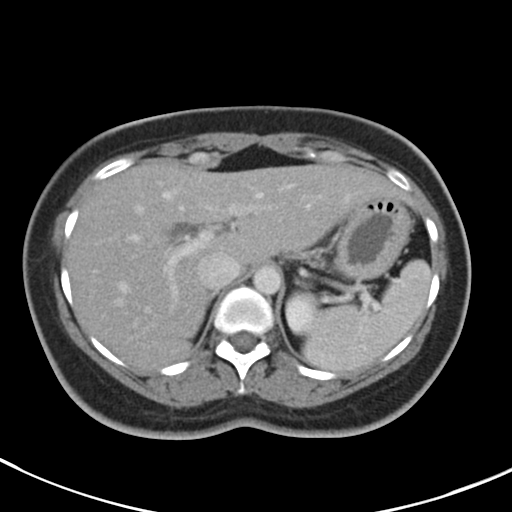
[im 74/87  soft-tissue]
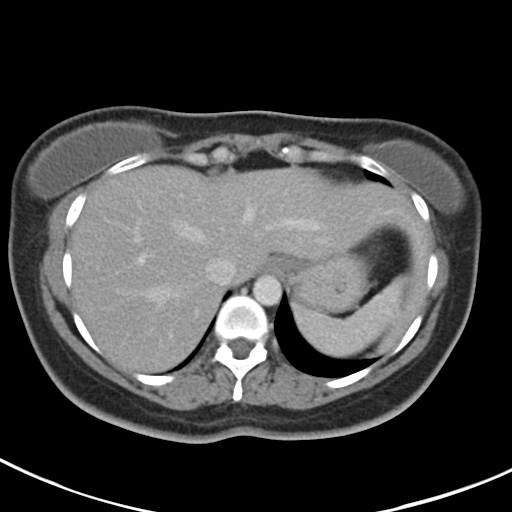
[im 82/87  soft-tissue]
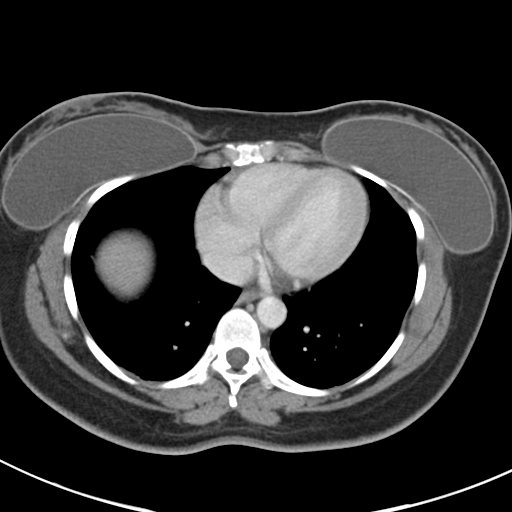

[Series 4: coronal a/|p · coronal · 0.61mm/px · 3 of 110 slices shown]
[im 37/110  soft-tissue]
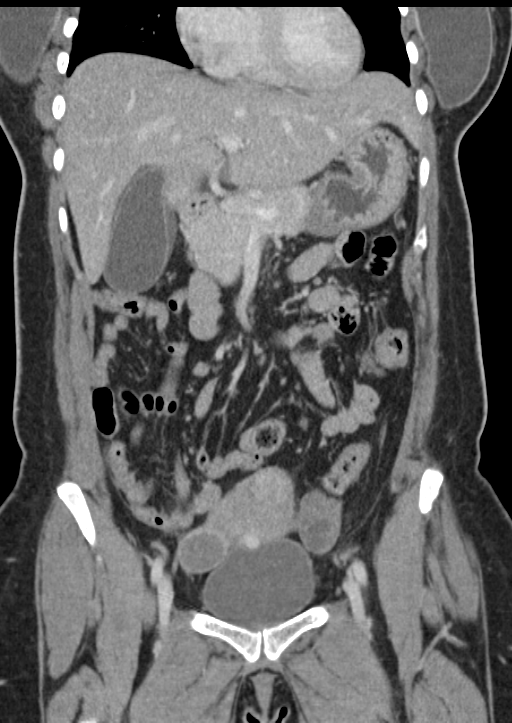
[im 49/110  soft-tissue]
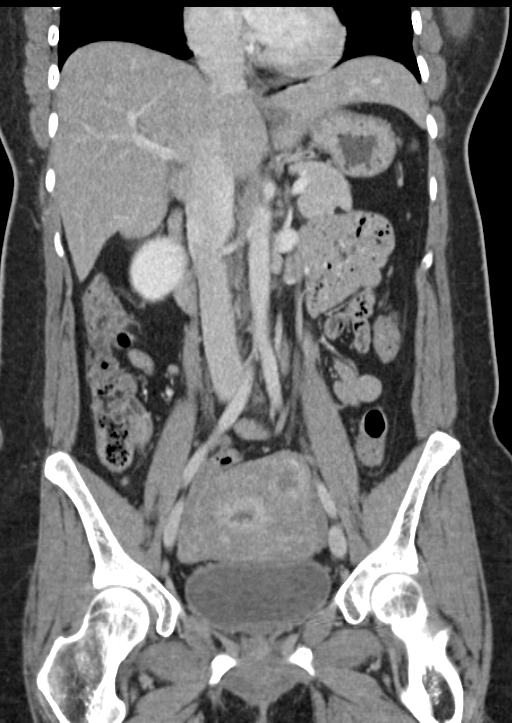
[im 61/110  soft-tissue]
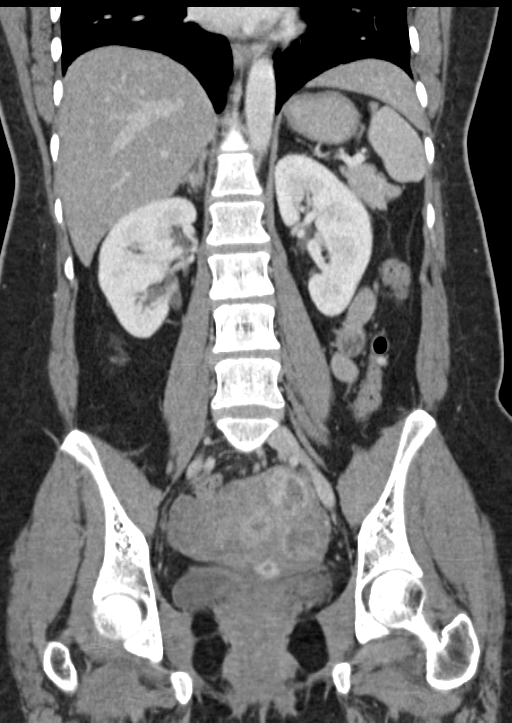

[15 of 46 positions shown; findings below may reference images not displayed]

FINDINGS: Lower chest: Bilateral breast implants are partially visualized.
Mild deep tendon atelectasis present within the visualized lung
bases. Visualized lungs are otherwise clear.

Hepatobiliary: Liver demonstrates a normal contrast enhanced
appearance. Scattered layering stones present within the gallbladder
lumen. Small amount of free pericholecystic fluid. Finding raises
the possibility for possibly of acute cholecystitis. No biliary
dilatation. No definite stones present within the common bile duct
itself.

Pancreas: Pancreas within normal limits.

Spleen: Spleen within normal limits.

Adrenals/Urinary Tract: Adrenal glands are normal. Kidneys equal in
size with symmetric enhancement. No nephrolithiasis, hydronephrosis,
or focal enhancing renal mass. Ureters of normal caliber without
acute abnormality. Duplication of the renal collecting systems
noted. Noted. Bladder within normal limits.

Stomach/Bowel: Stomach within normal limits. No evidence for bowel
obstruction. Appendix within normal limits. No abnormal wall
thickening, mucosal enhancement, or inflammatory fat stranding seen
about the bowels.

Vascular/Lymphatic: Normal intravascular enhancement seen throughout
the intra-abdominal aorta and its branch vessels. No adenopathy.

Reproductive: Uterus is enlarged with multiple fibroids present.
cm cystic lesion within the right adnexa favored to reflect a
pedunculated fibroid.

Other: No free air or fluid. Diastasis of the rectus abdominis
musculature noted without frank hernia.

Musculoskeletal: No acute osseous abnormality. No worrisome lytic or
blastic osseous lesions.
IMPRESSION: 1. Cholelithiasis with small volume free pericholecystic fluid.
Clinical correlation for possible acute cholecystitis recommended.
Additionally, correlation with dedicated right upper quadrant
ultrasound could be performed for further evaluation as clinically
desired.
2. No other acute intra-abdominal or pelvic process identified.
3. Fibroid uterus.
4. Diastasis of the rectus abdominis musculature without frank
hernia.
